# Patient Record
Sex: Female | Born: 1980 | Race: White | Hispanic: No | Marital: Married | Smoking: Never smoker
Health system: Southern US, Community
[De-identification: ages and names within clinical notes are randomized; demographics above are authoritative.]

## PROBLEM LIST (undated history)

## (undated) DIAGNOSIS — Z8489 Family history of other specified conditions: Secondary | ICD-10-CM

## (undated) DIAGNOSIS — Z9889 Other specified postprocedural states: Secondary | ICD-10-CM

## (undated) DIAGNOSIS — R112 Nausea with vomiting, unspecified: Secondary | ICD-10-CM

## (undated) DIAGNOSIS — T753XXA Motion sickness, initial encounter: Secondary | ICD-10-CM

## (undated) DIAGNOSIS — J45909 Unspecified asthma, uncomplicated: Secondary | ICD-10-CM

## (undated) HISTORY — PX: OVARIAN CYST REMOVAL: SHX89

## (undated) HISTORY — PX: CHOLECYSTECTOMY: SHX55

## (undated) HISTORY — PX: TONSILLECTOMY: SUR1361

## (undated) HISTORY — PX: APPENDECTOMY: SHX54

## (undated) HISTORY — PX: ABDOMINAL HYSTERECTOMY: SHX81

---

## 2014-01-25 ENCOUNTER — Other Ambulatory Visit: Payer: Self-pay | Admitting: Nurse Practitioner

## 2014-01-25 DIAGNOSIS — M25562 Pain in left knee: Secondary | ICD-10-CM

## 2014-01-26 ENCOUNTER — Ambulatory Visit
Admission: RE | Admit: 2014-01-26 | Discharge: 2014-01-26 | Disposition: A | Discharge status: Home / Self Care | Payer: PRIVATE HEALTH INSURANCE | Source: Ambulatory Visit | Attending: Nurse Practitioner | Admitting: Nurse Practitioner

## 2014-01-26 DIAGNOSIS — M25562 Pain in left knee: Secondary | ICD-10-CM

## 2014-01-29 ENCOUNTER — Other Ambulatory Visit: Payer: Self-pay

## 2015-08-12 ENCOUNTER — Encounter (HOSPITAL_COMMUNITY): Payer: Self-pay

## 2015-08-12 ENCOUNTER — Emergency Department (HOSPITAL_COMMUNITY): Payer: No Typology Code available for payment source

## 2015-08-12 ENCOUNTER — Emergency Department (HOSPITAL_COMMUNITY)
Admission: EM | Admit: 2015-08-12 | Discharge: 2015-08-12 | Disposition: A | Discharge status: Home / Self Care | Payer: No Typology Code available for payment source | Attending: Emergency Medicine | Admitting: Emergency Medicine

## 2015-08-12 DIAGNOSIS — Z880 Allergy status to penicillin: Secondary | ICD-10-CM | POA: Diagnosis not present

## 2015-08-12 DIAGNOSIS — S99912A Unspecified injury of left ankle, initial encounter: Secondary | ICD-10-CM | POA: Diagnosis present

## 2015-08-12 DIAGNOSIS — J329 Chronic sinusitis, unspecified: Secondary | ICD-10-CM

## 2015-08-12 DIAGNOSIS — X501XXA Overexertion from prolonged static or awkward postures, initial encounter: Secondary | ICD-10-CM | POA: Insufficient documentation

## 2015-08-12 DIAGNOSIS — S93402A Sprain of unspecified ligament of left ankle, initial encounter: Secondary | ICD-10-CM | POA: Diagnosis not present

## 2015-08-12 DIAGNOSIS — Y9289 Other specified places as the place of occurrence of the external cause: Secondary | ICD-10-CM | POA: Diagnosis not present

## 2015-08-12 DIAGNOSIS — Y998 Other external cause status: Secondary | ICD-10-CM | POA: Insufficient documentation

## 2015-08-12 DIAGNOSIS — Y9389 Activity, other specified: Secondary | ICD-10-CM | POA: Insufficient documentation

## 2015-08-12 MED ORDER — IBUPROFEN 800 MG PO TABS
800.0000 mg | ORAL_TABLET | Freq: Three times a day (TID) | ORAL | Status: DC
Start: 2015-08-12 — End: 2015-10-27

## 2015-08-12 MED ORDER — AZITHROMYCIN 250 MG PO TABS: 250.0000 mg | ORAL_TABLET | Freq: Every day | ORAL | Status: DC

## 2015-08-12 MED ORDER — HYDROCODONE-ACETAMINOPHEN 5-325 MG PO TABS: 1.0000 | ORAL_TABLET | Freq: Four times a day (QID) | ORAL | Status: DC | PRN

## 2015-08-12 NOTE — ED Provider Notes (Signed)
History  By signing my name below, I, Karle PlumberJennifer Tensley, attest that this documentation has been prepared under the direction and in the presence of Danelle BerryLeisa Rossetta Kama, PA-C. Electronically Signed: Karle PlumberJennifer Tensley, ED Scribe. 08/12/2015. 7:30 PM.  Chief Complaint  Patient presents with  . Ankle Injury   The history is provided by the patient and medical records. No language interpreter was used.    HPI Comments:  Beth Sheppard is a 34 y.o. obese female who presents to the Emergency Department complaining of a left ankle injury that occurred approximately 1.5 hours ago. Pt states she stepped off a curb and rolled the ankle. Pt reports feeling two pops at the time of the incident. She reports associated moderate pain of the left ankle, numbness to the plantar surface of the left foot and moderate swelling of the left ankle. Trying to move the left foot increases the pain. She denies alleviating factors. She has not taken anything for pain. She denies nausea or vomiting.  Pt also reports sinus pressure that started about two weeks ago. She has not taken anything for the symptoms today. She denies modifying factors. She denies fever or chills.   History reviewed. No pertinent past medical history. Past Surgical History  Procedure Laterality Date  . Tonsillectomy    . Cholecystectomy    . Appendectomy    . Ovarian cyst removal    . Abdominal hysterectomy     No family history on file. Social History  Substance Use Topics  . Smoking status: Never Smoker   . Smokeless tobacco: None  . Alcohol Use: No   OB History    No data available     Review of Systems  Constitutional: Negative for fever and chills.  HENT: Positive for sinus pressure.   Gastrointestinal: Negative for nausea and vomiting.  Musculoskeletal: Positive for joint swelling and arthralgias.  Skin: Negative for color change and wound.  Neurological: Positive for numbness.    Allergies  Amoxicillin and Serotonin reuptake  inhibitors (ssris)  Home Medications   Prior to Admission medications   Medication Sig Start Date End Date Taking? Authorizing Provider  azithromycin (ZITHROMAX) 250 MG tablet Take 1 tablet (250 mg total) by mouth daily. Take first 2 tablets together, then 1 every day until finished. 08/12/15   Danelle BerryLeisa Christpher Stogsdill, PA-C  HYDROcodone-acetaminophen (NORCO/VICODIN) 5-325 MG tablet Take 1-2 tablets by mouth every 6 (six) hours as needed for severe pain. 08/12/15   Danelle BerryLeisa Sharaine Delange, PA-C  ibuprofen (ADVIL,MOTRIN) 800 MG tablet Take 1 tablet (800 mg total) by mouth 3 (three) times daily. 08/12/15   Danelle BerryLeisa Schae Cando, PA-C   Triage Vitals: BP 130/95 mmHg  Pulse 79  Temp(Src) 97.3 F (36.3 C) (Oral)  Resp 18  SpO2 100% Physical Exam  Constitutional: She is oriented to person, place, and time. She appears well-developed and well-nourished. No distress.  HENT:  Head: Normocephalic and atraumatic.  Right Ear: External ear normal.  Left Ear: External ear normal.  Nose: Nose normal.  Mouth/Throat: Oropharynx is clear and moist. No oropharyngeal exudate.  Eyes: Conjunctivae and EOM are normal. Pupils are equal, round, and reactive to light. Right eye exhibits no discharge. Left eye exhibits no discharge. No scleral icterus.  Neck: Normal range of motion. Neck supple. No JVD present. No tracheal deviation present.  Cardiovascular: Normal rate and regular rhythm.   Pulses:      Dorsalis pedis pulses are 2+ on the right side, and 2+ on the left side.  Posterior tibial pulses are 2+ on the right side, and 2+ on the left side.  Pulmonary/Chest: Effort normal and breath sounds normal. No stridor. No respiratory distress.  Musculoskeletal: She exhibits edema and tenderness.       Left ankle: She exhibits decreased range of motion and swelling. She exhibits no ecchymosis, no deformity and no laceration. Tenderness. Lateral malleolus, AITFL and CF ligament tenderness found. No medial malleolus, no posterior TFL, no head  of 5th metatarsal and no proximal fibula tenderness found. Achilles tendon normal. Achilles tendon exhibits no defect and normal Thompson's test results.  Foot held in plantarflexion, with limited ROM, strength 2/5, when attempting to flex, great toe flexes minimally, otherwise toes and foot flexion limited.   Lymphadenopathy:    She has no cervical adenopathy.  Neurological: She is alert and oriented to person, place, and time. She exhibits normal muscle tone. Coordination normal.  Skin: Skin is warm and dry. No rash noted. She is not diaphoretic. No erythema. No pallor.  Psychiatric: She has a normal mood and affect. Her behavior is normal. Judgment and thought content normal.  Nursing note and vitals reviewed.   ED Course  Procedures (including critical care time) DIAGNOSTIC STUDIES: Oxygen Saturation is 100% on RA, normal by my interpretation.   COORDINATION OF CARE: 7:20 PM- Informed pt of negative X-Ray. Will order tib/fib X-ray. Will give referral to orthopedics. Pt verbalizes understanding and agrees to plan.  Medications - No data to display  Labs Review Labs Reviewed - No data to display  Imaging Review No results found. I have personally reviewed and evaluated these images and lab results as part of my medical decision-making.   EKG Interpretation None      MDM   Final diagnoses:  Severe ankle sprain, left, initial encounter  Sinusitis, unspecified chronicity, unspecified location    Patient with rolled ankle, pain and edema of left lateral malleolus.  X-Ray negative for obvious fracture or dislocation.  Pt's left ankle normal pulses, normal color, edema of lateral malleolus Pt complained of decreased sensation, pt can minimally move great toe or, minimal plantar flexion, decreased strength, normal passive ROM. Tib/fib added with concerns for peroneal nerve injury with decreased ROM, strength and sensation.  L tib/fib was also negative. The case was discussed  with Dr. Dalene Seltzer. Pt advised to follow up with orthopedics (established with ortho in [place]). Patient given camwalker and crutches while in ED, conservative therapy recommended and discussed, pain meds, RICE tx, and NSAIDS.   Patient will be discharged home & is agreeable with above plan. Returns precautions discussed.   Pt discharged home in stable condition.    I personally performed the services described in this documentation, which was scribed in my presence. The recorded information has been reviewed and is accurate.      Danelle Berry, PA-C 08/29/15 0981  Alvira Monday, MD 08/29/15 2053

## 2015-08-12 NOTE — Discharge Instructions (Signed)
Ankle Sprain °An ankle sprain is an injury to the strong, fibrous tissues (ligaments) that hold the bones of your ankle joint together.  °CAUSES °An ankle sprain is usually caused by a fall or by twisting your ankle. Ankle sprains most commonly occur when you step on the outer edge of your foot, and your ankle turns inward. People who participate in sports are more prone to these types of injuries.  °SYMPTOMS  °· Pain in your ankle. The pain may be present at rest or only when you are trying to stand or walk. °· Swelling. °· Bruising. Bruising may develop immediately or within 1 to 2 days after your injury. °· Difficulty standing or walking, particularly when turning corners or changing directions. °DIAGNOSIS  °Your caregiver will ask you details about your injury and perform a physical exam of your ankle to determine if you have an ankle sprain. During the physical exam, your caregiver will press on and apply pressure to specific areas of your foot and ankle. Your caregiver will try to move your ankle in certain ways. An X-ray exam may be done to be sure a bone was not broken or a ligament did not separate from one of the bones in your ankle (avulsion fracture).  °TREATMENT  °Certain types of braces can help stabilize your ankle. Your caregiver can make a recommendation for this. Your caregiver may recommend the use of medicine for pain. If your sprain is severe, your caregiver may refer you to a surgeon who helps to restore function to parts of your skeletal system (orthopedist) or a physical therapist. °HOME CARE INSTRUCTIONS  °· Apply ice to your injury for 1-2 days or as directed by your caregiver. Applying ice helps to reduce inflammation and pain. °· Put ice in a plastic bag. °· Place a towel between your skin and the bag. °· Leave the ice on for 15-20 minutes at a time, every 2 hours while you are awake. °· Only take over-the-counter or prescription medicines for pain, discomfort, or fever as directed by  your caregiver. °· Elevate your injured ankle above the level of your heart as much as possible for 2-3 days. °· If your caregiver recommends crutches, use them as instructed. Gradually put weight on the affected ankle. Continue to use crutches or a cane until you can walk without feeling pain in your ankle. °· If you have a plaster splint, wear the splint as directed by your caregiver. Do not rest it on anything harder than a pillow for the first 24 hours. Do not put weight on it. Do not get it wet. You may take it off to take a shower or bath. °· You may have been given an elastic bandage to wear around your ankle to provide support. If the elastic bandage is too tight (you have numbness or tingling in your foot or your foot becomes cold and blue), adjust the bandage to make it comfortable. °· If you have an air splint, you may blow more air into it or let air out to make it more comfortable. You may take your splint off at night and before taking a shower or bath. Wiggle your toes in the splint several times per day to decrease swelling. °SEEK MEDICAL CARE IF:  °· You have rapidly increasing bruising or swelling. °· Your toes feel extremely cold or you lose feeling in your foot. °· Your pain is not relieved with medicine. °SEEK IMMEDIATE MEDICAL CARE IF: °· Your toes are numb or blue. °·   You have severe pain that is increasing. MAKE SURE YOU:   Understand these instructions.  Will watch your condition.  Will get help right away if you are not doing well or get worse.   This information is not intended to replace advice given to you by your health care provider. Make sure you discuss any questions you have with your health care provider.   Document Released: 08/19/2005 Document Revised: 09/09/2014 Document Reviewed: 08/31/2011 Elsevier Interactive Patient Education 2016 Else[employer] Inc[employer]  Sinusitis, Adult Sinusitis is redness, soreness, and inflammation of the paranasal sinuses. Paranasal sinuses are  air pockets within the bones of your face. They are located beneath your eyes, in the middle of your forehead, and above your eyes. In healthy paranasal sinuses, mucus is able to drain out, and air is able to circulate through them by way of your nose. However, when your paranasal sinuses are inflamed, mucus and air can become trapped. This can allow bacteria and other germs to grow and cause infection. Sinusitis can develop quickly and last only a short time (acute) or continue over a long period (chronic). Sinusitis that lasts for more than 12 weeks is considered chronic. CAUSES Causes of sinusitis include:  Allergies.  Structural abnormalities, such as displacement of the cartilage that separates your nostrils (deviated septum), which can decrease the air flow through your nose and sinuses and affect sinus drainage.  Functional abnormalities, such as when the small hairs (cilia) that line your sinuses and help remove mucus do not work properly or are not present. SIGNS AND SYMPTOMS Symptoms of acute and chronic sinusitis are the same. The primary symptoms are pain and pressure around the affected sinuses. Other symptoms include:  Upper toothache.  Earache.  Headache.  Bad breath.  Decreased sense of smell and taste.  A cough, which worsens when you are lying flat.  Fatigue.  Fever.  Thick drainage from your nose, which often is green and may contain pus (purulent).  Swelling and warmth over the affected sinuses. DIAGNOSIS Your health care provider will perform a physical exam. During your exam, your health care provider may perform any of the following to help determine if you have acute sinusitis or chronic sinusitis:  Look in your nose for signs of abnormal growths in your nostrils (nasal polyps).  Tap over the affected sinus to check for signs of infection.  View the inside of your sinuses using an imaging device that has a light attached (endoscope). If your health care  provider suspects that you have chronic sinusitis, one or more of the following tests may be recommended:  Allergy tests.  Nasal culture. A sample of mucus is taken from your nose, sent to a lab, and screened for bacteria.  Nasal cytology. A sample of mucus is taken from your nose and examined by your health care provider to determine if your sinusitis is related to an allergy. TREATMENT Most cases of acute sinusitis are related to a viral infection and will resolve on their own within 10 days. Sometimes, medicines are prescribed to help relieve symptoms of both acute and chronic sinusitis. These may include pain medicines, decongestants, nasal steroid sprays, or saline sprays. However, for sinusitis related to a bacterial infection, your health care provider will prescribe antibiotic medicines. These are medicines that will help kill the bacteria causing the infection. Rarely, sinusitis is caused by a fungal infection. In these cases, your health care provider will prescribe antifungal medicine. For some cases of chronic sinusitis, surgery is needed.  Generally, these are cases in which sinusitis recurs more than 3 times per year, despite other treatments. HOME CARE INSTRUCTIONS  Drink plenty of water. Water helps thin the mucus so your sinuses can drain more easily.  Use a humidifier.  Inhale steam 3-4 times a day (for example, sit in the bathroom with the shower running).  Apply a warm, moist washcloth to your face 3-4 times a day, or as directed by your health care provider.  Use saline nasal sprays to help moisten and clean your sinuses.  Take medicines only as directed by your health care provider.  If you were prescribed either an antibiotic or antifungal medicine, finish it all even if you start to feel better. SEEK IMMEDIATE MEDICAL CARE IF:  You have increasing pain or severe headaches.  You have nausea, vomiting, or drowsiness.  You have swelling around your face.  You  have vision problems.  You have a stiff neck.  You have difficulty breathing.   This information is not intended to replace advice given to you by your health care provider. Make sure you discuss any questions you have with your health care provider.   Document Released: 08/19/2005 Document Revised: 09/09/2014 Document Reviewed: 09/03/2011 Elsevier Interactive Patient Education [employer] Else[employer] Inc.

## 2015-08-12 NOTE — ED Notes (Signed)
Pt presents with c/o left ankle injury. Pt reports that she rolled her ankle off of the curb, reports that her foot is numb at this time.

## 2015-09-20 ENCOUNTER — Other Ambulatory Visit: Payer: Self-pay | Admitting: Podiatry

## 2015-09-20 DIAGNOSIS — S93402D Sprain of unspecified ligament of left ankle, subsequent encounter: Secondary | ICD-10-CM

## 2015-10-10 ENCOUNTER — Ambulatory Visit
Admission: RE | Admit: 2015-10-10 | Discharge: 2015-10-10 | Disposition: A | Discharge status: Home / Self Care | Payer: No Typology Code available for payment source | Source: Ambulatory Visit | Attending: Podiatry | Admitting: Podiatry

## 2015-10-10 DIAGNOSIS — S93402D Sprain of unspecified ligament of left ankle, subsequent encounter: Secondary | ICD-10-CM

## 2015-10-10 DIAGNOSIS — S96812A Strain of other specified muscles and tendons at ankle and foot level, left foot, initial encounter: Secondary | ICD-10-CM | POA: Diagnosis not present

## 2015-10-10 DIAGNOSIS — S93402A Sprain of unspecified ligament of left ankle, initial encounter: Secondary | ICD-10-CM | POA: Diagnosis present

## 2015-10-10 DIAGNOSIS — M25472 Effusion, left ankle: Secondary | ICD-10-CM | POA: Insufficient documentation

## 2015-10-10 DIAGNOSIS — M25572 Pain in left ankle and joints of left foot: Secondary | ICD-10-CM | POA: Insufficient documentation

## 2015-10-27 ENCOUNTER — Encounter: Payer: Self-pay | Admitting: *Deleted

## 2015-11-01 ENCOUNTER — Ambulatory Visit
Admission: RE | Admit: 2015-11-01 | Discharge: 2015-11-01 | Disposition: A | Discharge status: Home / Self Care | Payer: No Typology Code available for payment source | Source: Ambulatory Visit | Attending: Podiatry | Admitting: Podiatry

## 2015-11-01 ENCOUNTER — Ambulatory Visit: Payer: No Typology Code available for payment source | Admitting: Anesthesiology

## 2015-11-01 ENCOUNTER — Encounter
Admission: RE | Disposition: A | Discharge status: Home / Self Care | Payer: Self-pay | Source: Ambulatory Visit | Attending: Podiatry

## 2015-11-01 DIAGNOSIS — M65872 Other synovitis and tenosynovitis, left ankle and foot: Secondary | ICD-10-CM | POA: Insufficient documentation

## 2015-11-01 DIAGNOSIS — Y939 Activity, unspecified: Secondary | ICD-10-CM | POA: Diagnosis not present

## 2015-11-01 DIAGNOSIS — S96812A Strain of other specified muscles and tendons at ankle and foot level, left foot, initial encounter: Secondary | ICD-10-CM | POA: Insufficient documentation

## 2015-11-01 DIAGNOSIS — X58XXXA Exposure to other specified factors, initial encounter: Secondary | ICD-10-CM | POA: Insufficient documentation

## 2015-11-01 DIAGNOSIS — M76829 Posterior tibial tendinitis, unspecified leg: Secondary | ICD-10-CM | POA: Diagnosis present

## 2015-11-01 DIAGNOSIS — J45909 Unspecified asthma, uncomplicated: Secondary | ICD-10-CM | POA: Insufficient documentation

## 2015-11-01 HISTORY — PX: ANKLE ARTHROSCOPY: SHX545

## 2015-11-01 HISTORY — DX: Nausea with vomiting, unspecified: R11.2

## 2015-11-01 HISTORY — DX: Motion sickness, initial encounter: T75.3XXA

## 2015-11-01 HISTORY — DX: Family history of other specified conditions: Z84.89

## 2015-11-01 HISTORY — DX: Other specified postprocedural states: Z98.890

## 2015-11-01 HISTORY — DX: Unspecified asthma, uncomplicated: J45.909

## 2015-11-01 SURGERY — ARTHROSCOPY, ANKLE
Anesthesia: Regional | Site: Foot | Laterality: Left | Wound class: Clean

## 2015-11-01 MED ORDER — OXYCODONE HCL 5 MG/5ML PO SOLN: 5.0000 mg | Freq: Once | ORAL | Status: AC | PRN

## 2015-11-01 MED ORDER — FENTANYL CITRATE (PF) 100 MCG/2ML IJ SOLN: 25.0000 ug | INTRAMUSCULAR | Status: DC | PRN

## 2015-11-01 MED ORDER — ROPIVACAINE HCL 5 MG/ML IJ SOLN
INTRAMUSCULAR | Status: DC | PRN
  Administered 2015-11-01: 50 mg via EPIDURAL
  Administered 2015-11-01: 150 mg via EPIDURAL

## 2015-11-01 MED ORDER — LACTATED RINGERS IV SOLN
INTRAVENOUS | Status: DC
  Administered 2015-11-01 (×2): via INTRAVENOUS

## 2015-11-01 MED ORDER — FENTANYL CITRATE (PF) 100 MCG/2ML IJ SOLN
INTRAMUSCULAR | Status: DC | PRN
  Administered 2015-11-01: 100 ug via INTRAVENOUS
  Administered 2015-11-01 (×2): 25 ug via INTRAVENOUS

## 2015-11-01 MED ORDER — OXYCODONE-ACETAMINOPHEN 5-325 MG PO TABS: 1.0000 | ORAL_TABLET | ORAL | Status: AC | PRN

## 2015-11-01 MED ORDER — BUPIVACAINE HCL (PF) 0.25 % IJ SOLN
INTRAMUSCULAR | Status: DC | PRN
  Administered 2015-11-01: 20 mL

## 2015-11-01 MED ORDER — ONDANSETRON HCL 4 MG/2ML IJ SOLN: 4.0000 mg | Freq: Four times a day (QID) | INTRAMUSCULAR | Status: DC | PRN

## 2015-11-01 MED ORDER — PROMETHAZINE HCL 25 MG/ML IJ SOLN
6.2500 mg | INTRAMUSCULAR | Status: DC | PRN
  Administered 2015-11-01: 6.25 mg via INTRAVENOUS

## 2015-11-01 MED ORDER — OXYCODONE HCL 5 MG PO TABS
5.0000 mg | ORAL_TABLET | Freq: Once | ORAL | Status: AC | PRN
  Administered 2015-11-01: 5 mg via ORAL

## 2015-11-01 MED ORDER — ONDANSETRON HCL 4 MG/2ML IJ SOLN
INTRAMUSCULAR | Status: DC | PRN
  Administered 2015-11-01: 4 mg via INTRAVENOUS

## 2015-11-01 MED ORDER — ONDANSETRON HCL 4 MG PO TABS: 4.0000 mg | ORAL_TABLET | Freq: Four times a day (QID) | ORAL | Status: DC | PRN

## 2015-11-01 MED ORDER — GLYCOPYRROLATE 0.2 MG/ML IJ SOLN
INTRAMUSCULAR | Status: DC | PRN
  Administered 2015-11-01: 0.1 mg via INTRAVENOUS

## 2015-11-01 MED ORDER — SCOPOLAMINE 1 MG/3DAYS TD PT72
1.0000 | MEDICATED_PATCH | Freq: Once | TRANSDERMAL | Status: DC
  Administered 2015-11-01: 1.5 mg via TRANSDERMAL

## 2015-11-01 MED ORDER — PROPOFOL 10 MG/ML IV BOLUS
INTRAVENOUS | Status: DC | PRN
  Administered 2015-11-01: 140 mg via INTRAVENOUS

## 2015-11-01 MED ORDER — OXYCODONE-ACETAMINOPHEN 5-325 MG PO TABS: 1.0000 | ORAL_TABLET | ORAL | Status: DC | PRN

## 2015-11-01 MED ORDER — DEXAMETHASONE SODIUM PHOSPHATE 4 MG/ML IJ SOLN
INTRAMUSCULAR | Status: DC | PRN
  Administered 2015-11-01: 4 mg via INTRAVENOUS

## 2015-11-01 MED ORDER — MIDAZOLAM HCL 2 MG/2ML IJ SOLN
INTRAMUSCULAR | Status: DC | PRN
  Administered 2015-11-01 (×2): 2 mg via INTRAVENOUS

## 2015-11-01 MED ORDER — SODIUM CHLORIDE 0.9 % IV SOLN
600.0000 mg | Freq: Once | INTRAVENOUS | Status: AC
  Administered 2015-11-01: 600 mg via INTRAVENOUS

## 2015-11-01 MED ORDER — LIDOCAINE HCL (CARDIAC) 20 MG/ML IV SOLN
INTRAVENOUS | Status: DC | PRN
  Administered 2015-11-01: 50 mg via INTRATRACHEAL

## 2015-11-01 SURGICAL SUPPLY — 45 items
BANDAGE ELASTIC 4 CLIP NS LF (GAUZE/BANDAGES/DRESSINGS) ×6 IMPLANT
BENZOIN TINCTURE PRP APPL 2/3 (GAUZE/BANDAGES/DRESSINGS) ×3 IMPLANT
BLADE AGGRESSIVE PLUS 4.0 (BLADE) ×3 IMPLANT
BNDG COHESIVE 4X5 TAN STRL (GAUZE/BANDAGES/DRESSINGS) ×3 IMPLANT
BNDG ESMARK 4X12 TAN STRL LF (GAUZE/BANDAGES/DRESSINGS) ×3 IMPLANT
BNDG GAUZE 4.5X4.1 6PLY STRL (MISCELLANEOUS) IMPLANT
BNDG STRETCH 4X75 STRL LF (GAUZE/BANDAGES/DRESSINGS) ×3 IMPLANT
BUR AGGRESSIVE+ 2.5 (BURR) ×3 IMPLANT
CANISTER SUCT 1200ML W/VALVE (MISCELLANEOUS) ×3 IMPLANT
COVER LIGHT HANDLE UNIVERSAL (MISCELLANEOUS) ×6 IMPLANT
CUFF TOURN SGL QUICK 34 (TOURNIQUET CUFF) ×1
CUFF TRNQT CYL 34X4X40X1 (TOURNIQUET CUFF) ×2 IMPLANT
DURAPREP 26ML APPLICATOR (WOUND CARE) ×3 IMPLANT
ETHIBOND 2 0 GREEN CT 2 30IN (SUTURE) ×3 IMPLANT
GAUZE PETRO XEROFOAM 1X8 (MISCELLANEOUS) ×3 IMPLANT
GAUZE SPONGE 4X4 12PLY STRL (GAUZE/BANDAGES/DRESSINGS) ×3 IMPLANT
GAUZE STRETCH 2X75IN STRL (MISCELLANEOUS) ×3 IMPLANT
GLOVE BIO SURGEON STRL SZ7.5 (GLOVE) ×6 IMPLANT
GLOVE INDICATOR 8.0 STRL GRN (GLOVE) ×6 IMPLANT
GOWN STRL REUS W/ TWL LRG LVL3 (GOWN DISPOSABLE) ×4 IMPLANT
GOWN STRL REUS W/TWL LRG LVL3 (GOWN DISPOSABLE) ×2
IV LACTATED RINGER IRRG 3000ML (IV SOLUTION) ×2
IV LR IRRIG 3000ML ARTHROMATIC (IV SOLUTION) ×4 IMPLANT
KIT ROOM TURNOVER OR (KITS) ×3 IMPLANT
MANIFOLD 4PT FOR NEPTUNE1 (MISCELLANEOUS) ×3 IMPLANT
NS IRRIG 500ML POUR BTL (IV SOLUTION) ×3 IMPLANT
PACK ARTHROSCOPY KNEE (MISCELLANEOUS) IMPLANT
PACK EXTREMITY ARMC (MISCELLANEOUS) ×3 IMPLANT
PAD GROUND ADULT SPLIT (MISCELLANEOUS) ×3 IMPLANT
SET TUBE SUCT SHAVER OUTFL 24K (TUBING) ×3 IMPLANT
SPLINT CAST 1 STEP 5X30 WHT (MISCELLANEOUS) ×3 IMPLANT
STOCKINETTE STRL 6IN 960660 (GAUZE/BANDAGES/DRESSINGS) ×3 IMPLANT
STRAP BODY AND KNEE 60X3 (MISCELLANEOUS) ×6 IMPLANT
STRIP CLOSURE SKIN 1/4X4 (GAUZE/BANDAGES/DRESSINGS) ×3 IMPLANT
SUT ETHILON 3-0 FS-10 30 BLK (SUTURE) ×6
SUT ETHILON 4-0 (SUTURE)
SUT ETHILON 4-0 FS2 18XMFL BLK (SUTURE)
SUT PDS 2-0 27IN (SUTURE) ×3 IMPLANT
SUT VIC AB 3-0 SH 27 (SUTURE) ×1
SUT VIC AB 3-0 SH 27X BRD (SUTURE) ×2 IMPLANT
SUT VIC AB 4-0 FS2 27 (SUTURE) IMPLANT
SUTURE EHLN 3-0 FS-10 30 BLK (SUTURE) ×4 IMPLANT
SUTURE ETHLN 4-0 FS2 18XMF BLK (SUTURE) IMPLANT
TUBING ARTHRO INFLOW-ONLY STRL (TUBING) ×3 IMPLANT
WAND TOPAZ MICRO DEBRIDER (MISCELLANEOUS) ×6 IMPLANT

## 2015-11-01 NOTE — Anesthesia Preprocedure Evaluation (Signed)
Anesthesia Evaluation  Patient identified by MRN, date of birth, ID band Patient awake    Reviewed: Allergy & Precautions, H&P , NPO status , Patient's Chart, lab work & pertinent test results, reviewed documented beta blocker date and time   History of Anesthesia Complications (+) PONV and history of anesthetic complications  Airway Mallampati: II  TM Distance: >3 FB Neck ROM: full    Dental no notable dental hx.    Pulmonary asthma (allergy-related) ,    Pulmonary exam normal breath sounds clear to auscultation       Cardiovascular Exercise Tolerance: Good negative cardio ROS   Rhythm:regular Rate:Normal     Neuro/Psych negative neurological ROS  negative psych ROS   GI/Hepatic negative GI ROS, Neg liver ROS,   Endo/Other  negative endocrine ROS  Renal/GU negative Renal ROS  negative genitourinary   Musculoskeletal   Abdominal   Peds  Hematology negative hematology ROS (+)   Anesthesia Other Findings   Reproductive/Obstetrics negative OB ROS                             Anesthesia Physical Anesthesia Plan  ASA: II  Anesthesia Plan: General and Regional   Post-op Pain Management: GA combined w/ Regional for post-op pain   Induction:   Airway Management Planned:   Additional Equipment:   Intra-op Plan:   Post-operative Plan:   Informed Consent: I have reviewed the patients History and Physical, chart, labs and discussed the procedure including the risks, benefits and alternatives for the proposed anesthesia with the patient or authorized representative who has indicated his/her understanding and acceptance.   Dental Advisory Given  Plan Discussed with: CRNA  Anesthesia Plan Comments:         Anesthesia Quick Evaluation

## 2015-11-01 NOTE — Transfer of Care (Signed)
Immediate Anesthesia Transfer of Care Note  Patient: Liliann File  Procedure(s) Performed: Procedure(s) with comments: ANKLE ARTHROSCOPY (Left) - WITH POPLITEAL PERONEAL TENDON REPAIR (Left) TIBIALIS TENDONITIS REPAIR TENOLYSIS (Left)  Patient Location: PACU  Anesthesia Type: General, Regional  Level of Consciousness: awake, alert  and patient cooperative  Airway and Oxygen Therapy: Patient Spontanous Breathing and Patient connected to supplemental oxygen  Post-op Assessment: Post-op Vital signs reviewed, Patient's Cardiovascular Status Stable, Respiratory Function Stable, Patent Airway and No signs of Nausea or vomiting  Post-op Vital Signs: Reviewed and stable  Complications: No apparent anesthesia complications

## 2015-11-01 NOTE — H&P (Signed)
  HISTORY AND PHYSICAL INTERVAL NOTE:  11/01/2015  12:44 PM  Beth Sheppard  has presented today for surgery, with the diagnosis of S86.312S PERONEALTENDON TEAR M76.822 TIBIAL TENDINITIS LEFT LOWER EXTREMITY M65.9 SYNOVITIS.  The various methods of treatment have been discussed with the patient.  No guarantees were given.  After consideration of risks, benefits and other options for treatment, the patient has consented to surgery.  I have reviewed the patients' chart and labs.    Patient Vitals for the past 24 hrs:  BP Temp Temp src Pulse Resp SpO2 Height Weight  11/01/15 1030 112/73 mmHg 97.5 F (36.4 C) Temporal 70 14 98 %  (1.702 m) 112.946 kg (249 lb)    A history and physical examination was performed in my office.  The patient was reexamined.  There have been no changes to this history and physical examination.  Gwyneth Revels A

## 2015-11-01 NOTE — Op Note (Signed)
Operative note   Surgeon:Stephano Arrants [employer] logistics/support/administrative officer: None    Preop diagnosis: 1. Peroneal brevis tendon tear  2. Peroneal longus tenosynovitis 3. Posterior tibial tendinitis and tenosynovitis  4. Ankle joint synovitis all left lower extremity    Postop diagnosis: Same    Procedure:1. Peroneal brevis tendon repair  2. tenolysis peroneal longus tendon  3. Tenolysis a posterior tibial tendon  4. Capsulitis and synovitis all left lower extremity    EBL: Minimal    Anesthesia:regional and general    Hemostasis: Thigh tourniquet inflated to 325 mmHg for 70 minutes    Specimen: Torn peroneal brevis tendon and tenosynovitis    Complications: None    Operative indications:Beth Sheppard is an 35 y.o. that presents today for surgical intervention.  The risks/benefits/alternatives/complications have been discussed and consent has been given.    Procedure:  Patient was brought into the OR and placed on the operating table in thesupine position. After anesthesia was obtained theleft lower extremity was prepped and draped in usual sterile fashion.  Attention was directed to the anterior aspect of the ankle joint where the anteromedial and lateral portals were made with a superficial skin incision and hemostat down to the capsule. At this time the anteromedial central and lateral aspect of the ankle joint was evaluated. The cartilage was noted be intact. There was a fair amount of capsulitis noted. There was a small accessory anterior inferior tibiotalar ligament. This was causing and anterior impingement syndrome. Debridement extensively was performed throughout the ankle joint. The accessory ligament was noted and debrided and removed. At this time no further impingement syndrome was noted. The ankle arthroscopy was completed. Ankle at tendon. Sharp and blunt dissection carried down to the tendon sheath. This was then opened. The posterior tibial tendon was noted with minimal tenosynovitis. This was  removed. The Topaz wand was then used along the posterior tibial tendon for decreasing inflammation and increasing circulation. Closure was performed with a 3-0 Vicryl for the deeper and subcutaneous tissue and a 3-0 nylon for the skin.  Attention was then directed laterally overlying the peroneal tendons where approximately 5 cm proximal to the ankle joint extending distally to just proximal to the fifth metatarsal base a curved incision was performed. Sharp and blunt dissection carried down to the tendon sheath. The tendon sheath was then opened along the incision site. There was noted be a fairly extensive split peroneal brevis tendon tear. This was evaluated. I was able to remove the small peroneal brevis tendon tear. The tendon itself that was remaining was in good condition. I was able to apply the Topaz wand along the length of the tendon and tubularize this with a 2-0 Ethibond suture. The peroneal longus tendon was noted to have a small amount of tenosynovitis and the Topaz wand was used along this area. There was also noted to be a low-lying muscle belly of the peroneal brevis tendon and this was resected to proximal to the ankle joint. All areas were flushed with irrigation. Closure was performed to the peroneal retinaculum with a 2-0 Ethibond. The peroneal tendon sheath was closed with a 2-0 PDS. Subcutaneous tissue was closed with a 3-0 Vicryl and the skin closed with 3-0 nylon. The anterior medial and lateral portals were closed with 3-0 nylon. All areas were then infiltrated with 0.25% Marcaine. A well compressive bulky sterile dressing was applied. A posterior splint was then applied.      Patient tolerated the procedure and anesthesia well.  Was  transported from the OR to the PACU with all vital signs stable and vascular status intact. To be discharged per routine protocol.  Will follow up in approximately 1 week in the outpatient clinic.

## 2015-11-01 NOTE — Anesthesia Postprocedure Evaluation (Signed)
Anesthesia Post Note  Patient: Beth Sheppard  Procedure(s) Performed: Procedure(s) (LRB): Peroneal brevis tendon repair 2. tenolysis peroneal longus tendon 3. Tenolysis a posterior tibial tendon 4. Capsulitis and synovitis all left lower extremity   (Left)  Patient location during evaluation: PACU Anesthesia Type: General Level of consciousness: awake and alert Pain management: pain level controlled Vital Signs Assessment: post-procedure vital signs reviewed and stable Respiratory status: spontaneous breathing, nonlabored ventilation, respiratory function stable and patient connected to nasal cannula oxygen Cardiovascular status: blood pressure returned to baseline and stable Postop Assessment: no signs of nausea or vomiting Anesthetic complications: no    [name]

## 2015-11-01 NOTE — Anesthesia Procedure Notes (Addendum)
Anesthesia Regional Block:  Popliteal block  Pre-Anesthetic Checklist: ,, timeout performed, Correct Patient, Correct Site, Correct Laterality, Correct Procedure, Correct Position, site marked, Risks and benefits discussed,  Surgical consent,  Pre-op evaluation,  At surgeon's request and post-op pain management   Prep: chloraprep       Needles:  Injection technique: Single-shot  Needle Type: Echogenic Needle     Needle Length: 9cm 9 cm Needle Gauge: 21 and 21 G    Additional Needles:  Procedures: ultrasound guided (picture in chart) and nerve stimulator  Motor weakness within 10 minutes. Popliteal block Narrative:  Start time: 11/01/2015 11:03 AM End time: 11/01/2015 11:12 AM Injection made incrementally with aspirations every 5 mL.  Performed by: Personally  Anesthesiologist: BEACH, RACHEL B  Additional Notes: Functioning IV was confirmed and monitors applied. Ultrasound guidance: relevant anatomy identified, needle position confirmed, local anesthetic spread visualized around nerve(s)., vascular puncture avoided.  Image printed for medical record.  Negative aspiration and no paresthesias; incremental administration of local anesthetic. The patient tolerated the procedure well. Vitals signes recorded in RN notes.   Procedure Name: LMA Insertion Date/Time: 11/01/2015 1:10 PM Performed by: Jimmy Picket Pre-anesthesia Checklist: Patient identified, Emergency Drugs available, Suction available, Timeout performed and Patient being monitored Patient Re-evaluated:Patient Re-evaluated prior to inductionOxygen Delivery Method: Circle system utilized Preoxygenation: Pre-oxygenation with 100% oxygen Intubation Type: IV induction LMA: LMA inserted LMA Size: 4.0 Number of attempts: 1 Placement Confirmation: positive ETCO2 and breath sounds checked- equal and bilateral Tube secured with: Tape

## 2015-11-01 NOTE — Discharge Instructions (Signed)
[facility]  POST OPERATIVE INSTRUCTIONS FOR DR. TROXLER AND DR[facility]   1. Take your medication as prescribed.  Pain medication should be taken only as needed.  2. Keep the dressing clean, dry and intact.  3. Keep your foot elevated above the heart level for the first 48 hours.  4. Walking to the bathroom and brief periods of walking are acceptable, unless we have instructed you to be non-weight bearing.  5. Always wear your post-op shoe when walking.  Always use your crutches if you are to be non-weight bearing.  6. Do not take a shower.  Keep dressing and splint clean and dry.     7. Call [facility] [phone]) if any of the following problems occur: - You develop a temperature or fever. - The bandage becomes saturated with blood. - Medication does not stop your pain. - Injury of the foot occurs. - Any symptoms of infection including redness, odor, or red streaks running from wound.  General Anesthesia, Adult, Care After Refer to this sheet in the next few weeks. These instructions provide you with information on caring for yourself after your procedure. Your health care provider may also give you more specific instructions. Your treatment has been planned according to current medical practices, but problems sometimes occur. Call your health care provider if you have any problems or questions after your procedure. WHAT TO EXPECT AFTER THE PROCEDURE After the procedure, it is typical to experience:  Sleepiness.  Nausea and vomiting. HOME CARE INSTRUCTIONS  For the first 24 hours after general anesthesia:  Have a responsible person with you.  Do not drive a car. If you are alone, do not take public transportation.  Do not drink alcohol.  Do not take medicine that has not been prescribed by your health care provider.  Do not sign important papers or make important decisions.  You  may resume a normal diet and activities as directed by your health care provider.  Change bandages (dressings) as directed.  If you have questions or problems that seem related to general anesthesia, call the hospital and ask for the anesthetist or anesthesiologist on call. SEEK MEDICAL CARE IF:  You have nausea and vomiting that continue the day after anesthesia.  You develop a rash. SEEK IMMEDIATE MEDICAL CARE IF:   You have difficulty breathing.  You have chest pain.  You have any allergic problems.   This information is not intended to replace advice given to you by your health care provider. Make sure you discuss any questions you have with your health care provider.   Document Released: 11/25/2000 Document Revised: 09/09/2014 Document Reviewed: 12/18/2011 Elsevier Interactive Patient Education [employer]

## 2015-11-01 NOTE — Progress Notes (Signed)
Assisted [employer] ANMD with left, popliteal/saphenous block. Side rails up, monitors on throughout procedure. See vital signs in flow sheet. Tolerated Procedure well.

## 2015-11-02 ENCOUNTER — Encounter: Payer: Self-pay | Admitting: Podiatry

## 2015-11-03 LAB — SURGICAL PATHOLOGY

## 2015-11-22 ENCOUNTER — Ambulatory Visit
Admission: RE | Admit: 2015-11-22 | Discharge: 2015-11-22 | Disposition: A | Discharge status: Home / Self Care | Payer: No Typology Code available for payment source | Source: Ambulatory Visit | Attending: Podiatry | Admitting: Podiatry

## 2015-11-22 ENCOUNTER — Other Ambulatory Visit: Payer: Self-pay | Admitting: Podiatry

## 2015-11-22 DIAGNOSIS — M79662 Pain in left lower leg: Secondary | ICD-10-CM

## 2015-11-22 DIAGNOSIS — R609 Edema, unspecified: Secondary | ICD-10-CM | POA: Insufficient documentation

## 2016-02-06 ENCOUNTER — Other Ambulatory Visit: Payer: Self-pay | Admitting: Podiatry

## 2016-02-06 ENCOUNTER — Ambulatory Visit
Admission: RE | Admit: 2016-02-06 | Discharge: 2016-02-06 | Disposition: A | Discharge status: Home / Self Care | Payer: No Typology Code available for payment source | Source: Ambulatory Visit | Attending: Podiatry | Admitting: Podiatry

## 2016-02-06 DIAGNOSIS — M79605 Pain in left leg: Secondary | ICD-10-CM | POA: Insufficient documentation

## 2016-02-06 DIAGNOSIS — M25562 Pain in left knee: Secondary | ICD-10-CM

## 2016-02-07 ENCOUNTER — Other Ambulatory Visit: Payer: Self-pay | Admitting: Podiatry

## 2016-02-07 DIAGNOSIS — S93402D Sprain of unspecified ligament of left ankle, subsequent encounter: Secondary | ICD-10-CM

## 2016-02-09 ENCOUNTER — Other Ambulatory Visit: Payer: Self-pay | Admitting: Podiatry

## 2016-02-09 DIAGNOSIS — R609 Edema, unspecified: Secondary | ICD-10-CM

## 2016-02-09 DIAGNOSIS — M25572 Pain in left ankle and joints of left foot: Secondary | ICD-10-CM

## 2016-02-10 ENCOUNTER — Ambulatory Visit
Admission: RE | Admit: 2016-02-10 | Discharge: 2016-02-10 | Disposition: A | Discharge status: Home / Self Care | Payer: No Typology Code available for payment source | Source: Ambulatory Visit | Attending: Podiatry | Admitting: Podiatry

## 2016-02-10 DIAGNOSIS — R609 Edema, unspecified: Secondary | ICD-10-CM

## 2016-02-10 DIAGNOSIS — M25572 Pain in left ankle and joints of left foot: Secondary | ICD-10-CM

## 2016-03-11 ENCOUNTER — Ambulatory Visit: Payer: No Typology Code available for payment source

## 2016-09-05 IMAGING — US US EXTREM LOW VENOUS*L*
1 series · 13 of 24 positions shown · non-contrast
Comparison: None.

CLINICAL DATA: Left calf pain and swelling for the past 4 days.
History of ankle surgery 3 weeks ago. Evaluate for DVT.



[Series 1: us extrem low venous*left* · 0.08mm/px · 13 of 35 slices shown]
[im 1/35]
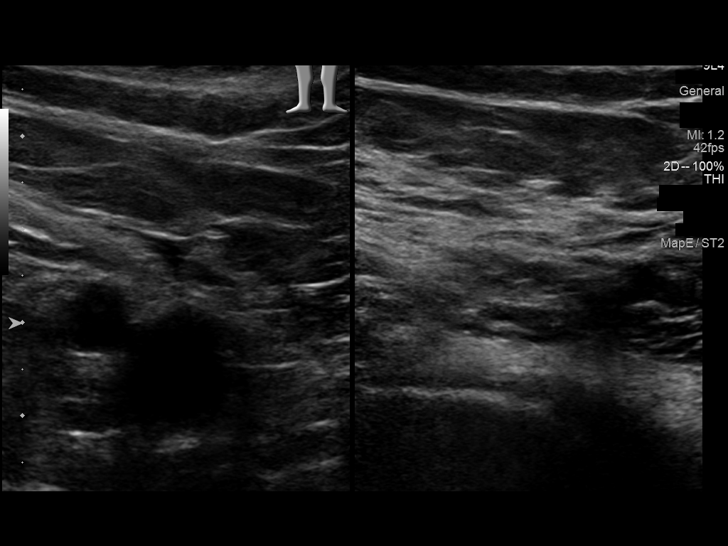
[im 3/35]
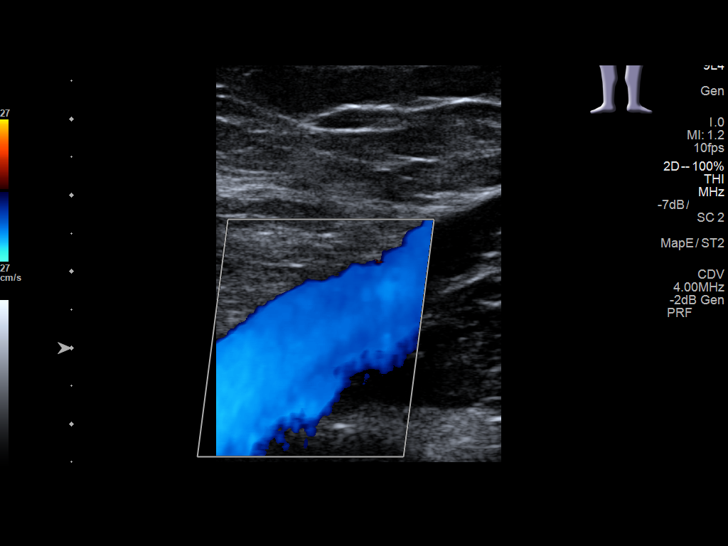
[im 6/35]
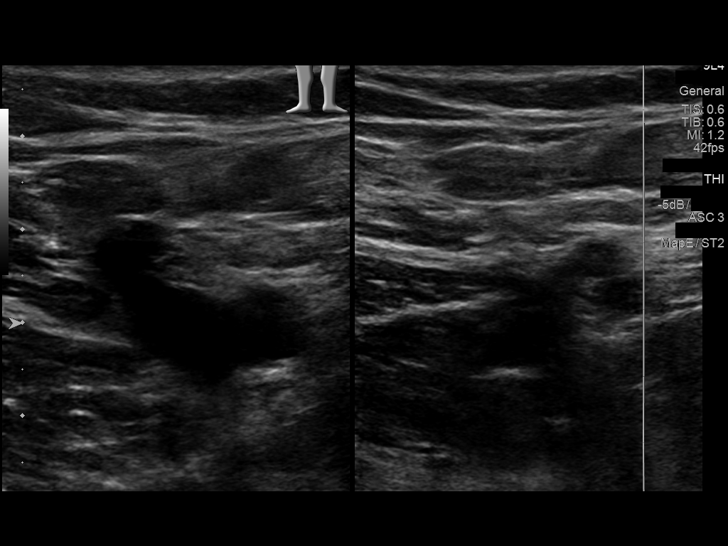
[im 9/35]
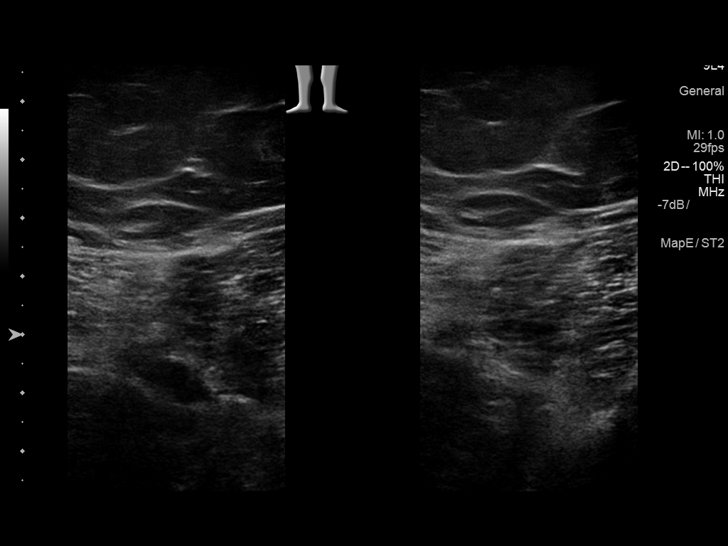
[im 12/35]
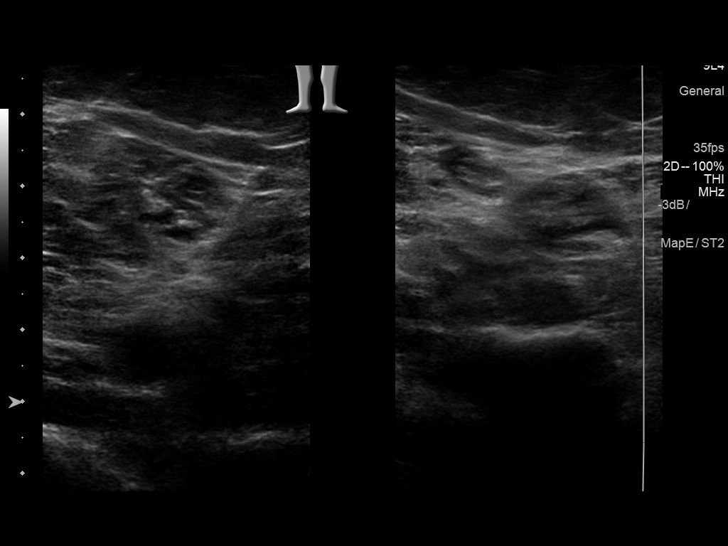
[im 15/35]
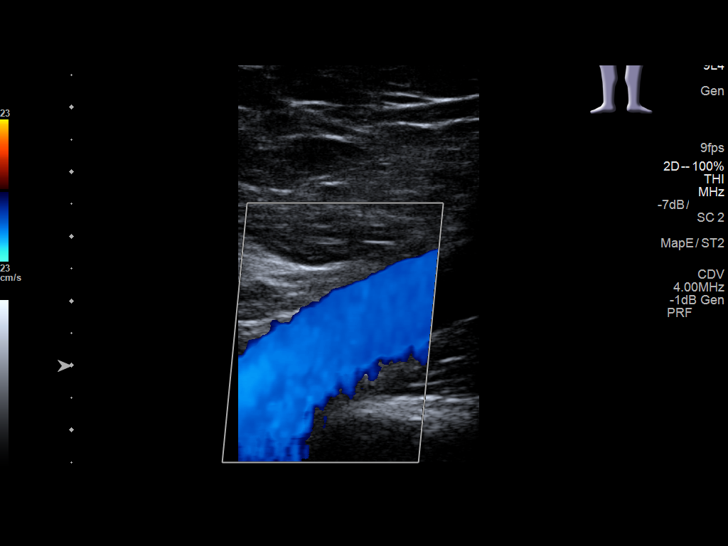
[im 18/35]
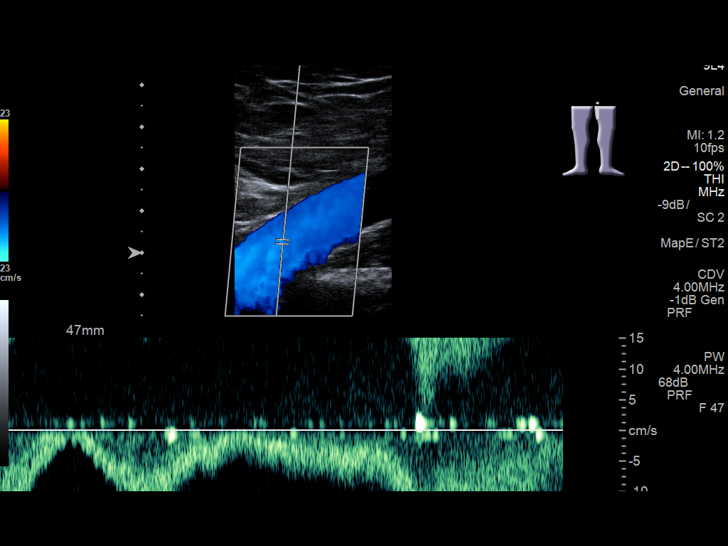
[im 20/35]
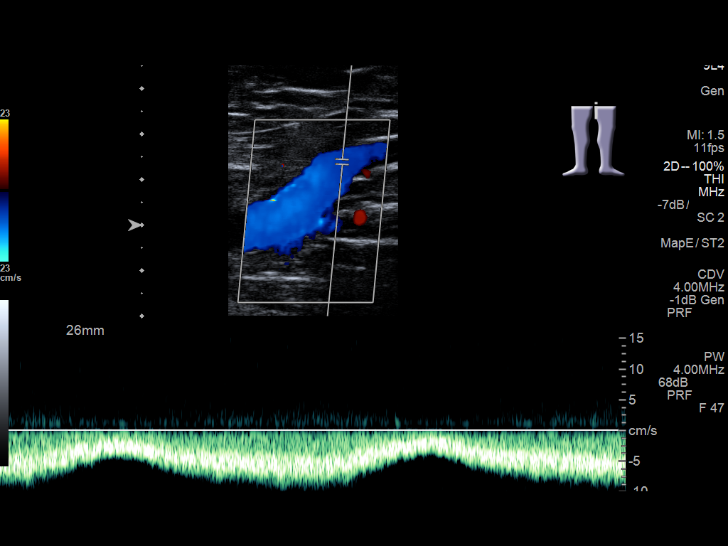
[im 23/35]
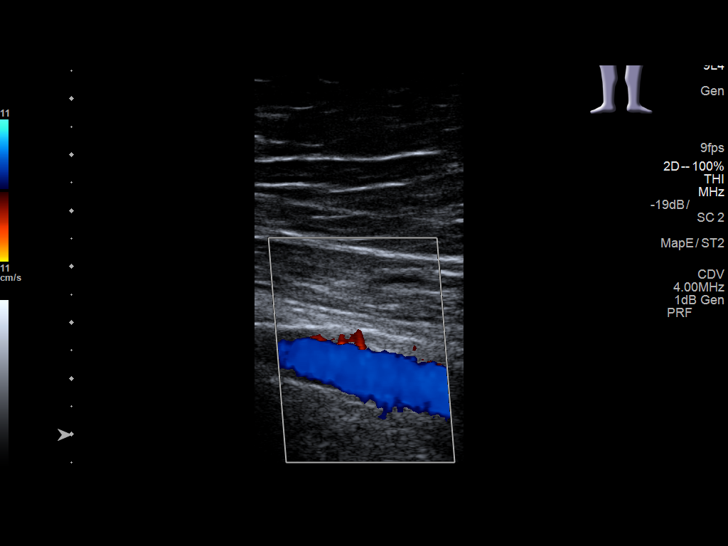
[im 26/35]
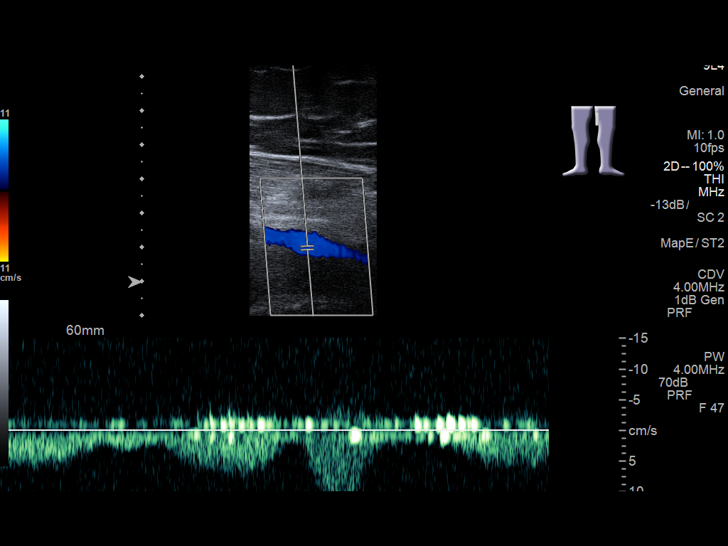
[im 29/35]
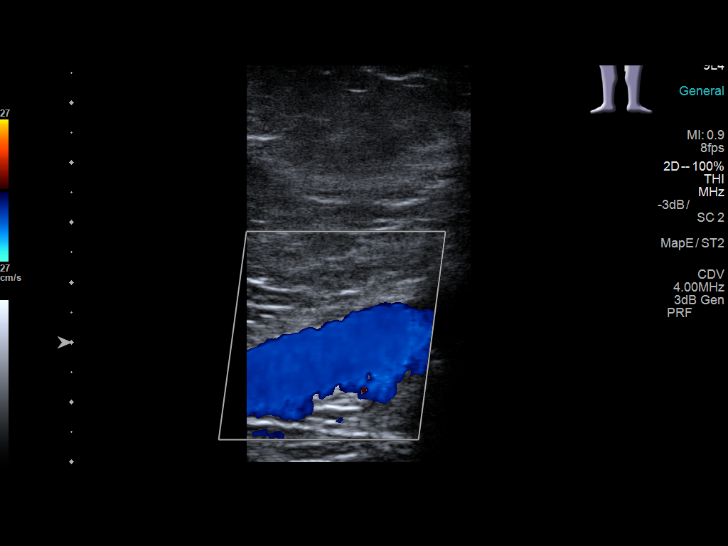
[im 32/35]
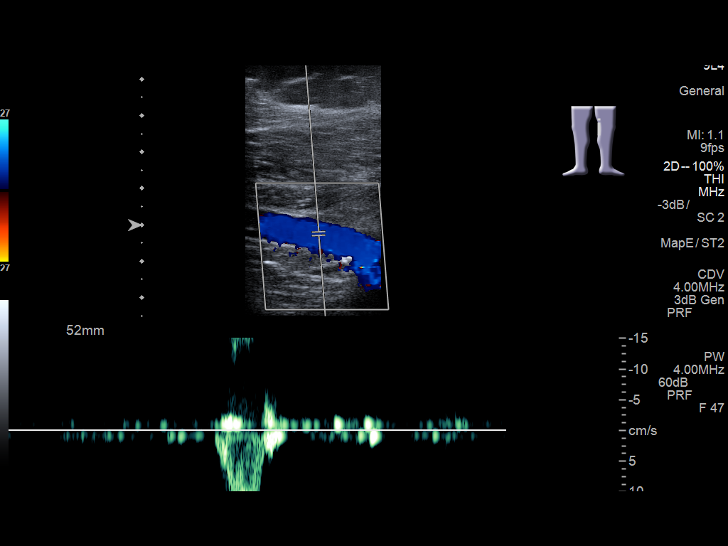
[im 35/35]
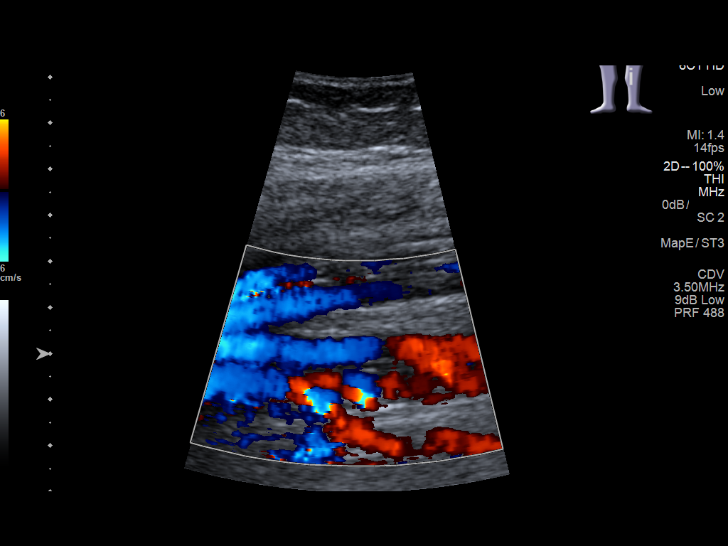

[13 of 24 positions shown; findings below may reference images not displayed]

FINDINGS: Contralateral Common Femoral Vein: Respiratory phasicity is normal
and symmetric with the symptomatic side. No evidence of thrombus.
Normal compressibility.

Common Femoral Vein: No evidence of thrombus. Normal
compressibility, respiratory phasicity and response to augmentation.

Saphenofemoral Junction: No evidence of thrombus. Normal
compressibility and flow on color Doppler imaging.

Profunda Femoral Vein: No evidence of thrombus. Normal
compressibility and flow on color Doppler imaging.

Femoral Vein: No evidence of thrombus. Normal compressibility,
respiratory phasicity and response to augmentation.

Popliteal Vein: No evidence of thrombus. Normal compressibility,
respiratory phasicity and response to augmentation.

Calf Veins: No evidence of thrombus. Normal compressibility and flow
on color Doppler imaging.

Superficial Great Saphenous Vein: No evidence of thrombus. Normal
compressibility and flow on color Doppler imaging.

Venous Reflux:  None.

Other Findings:  None.
IMPRESSION: No evidence of DVT within the left lower extremity.

## 2016-11-20 IMAGING — US US EXTREM LOW VENOUS*L*
1 series · 13 of 24 positions shown · non-contrast
Comparison: Prior duplex venous ultrasound 11/22/2015

CLINICAL DATA: 34-year-old female with left lower extremity pain
and history of left ankle surgery 3 months previously.



[Series 1: us extrem low venous*left* · 0.07mm/px · 13 of 36 slices shown]
[im 1/36]
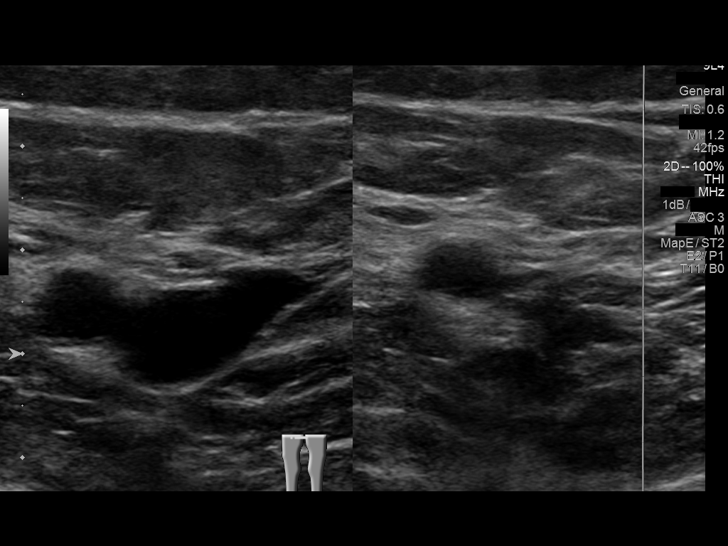
[im 4/36]
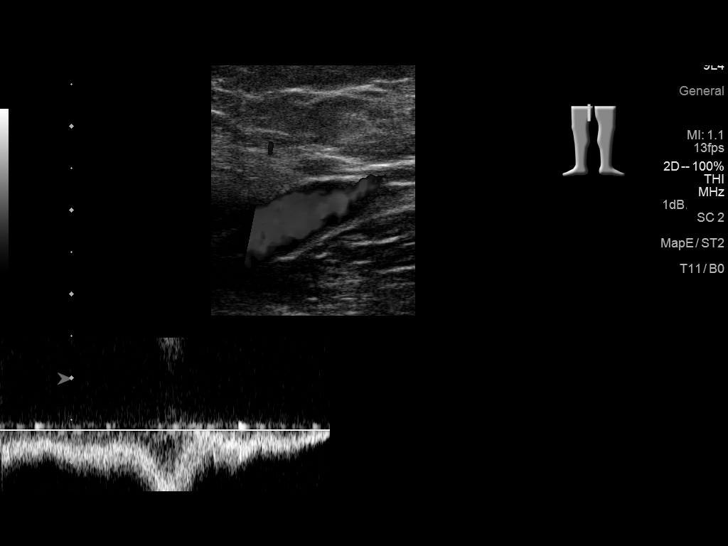
[im 7/36]
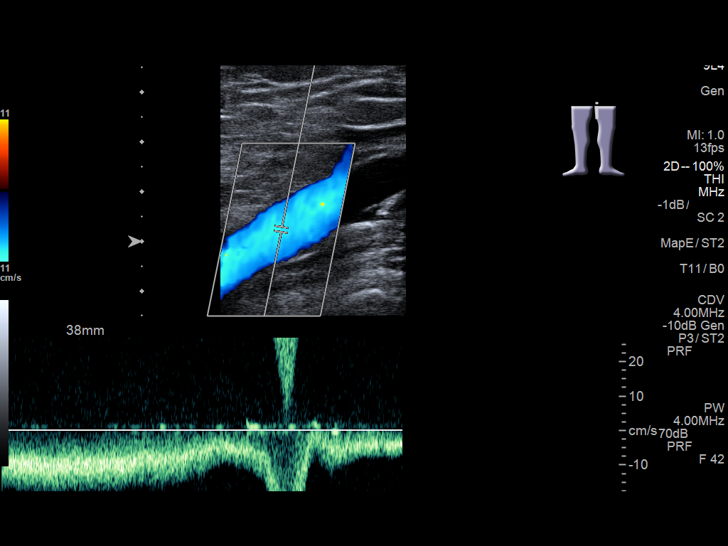
[im 10/36]
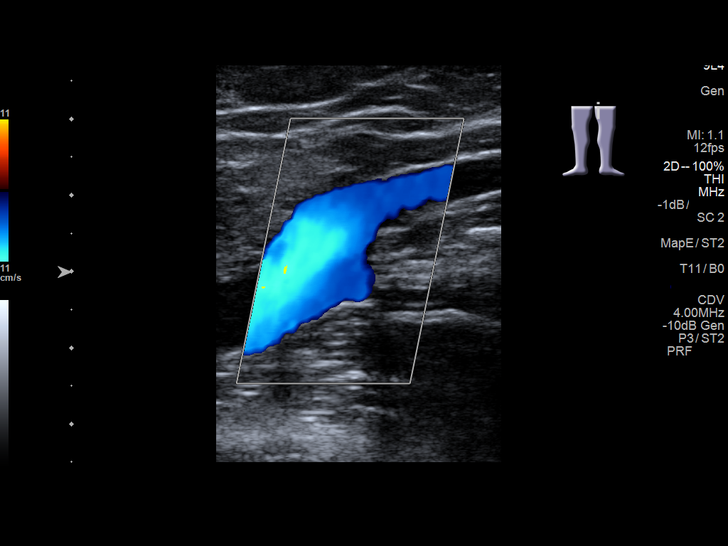
[im 13/36]
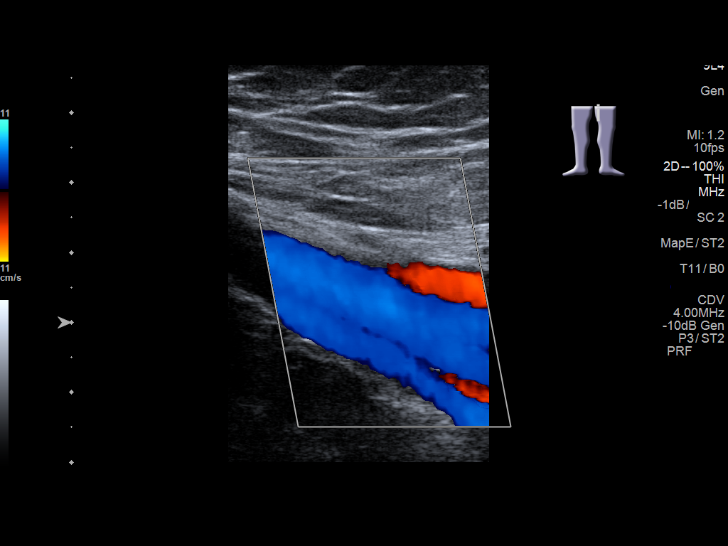
[im 16/36]
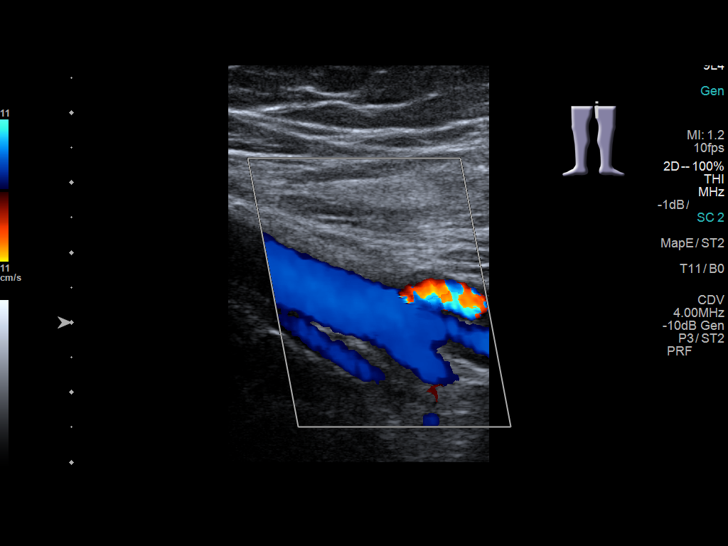
[im 19/36]
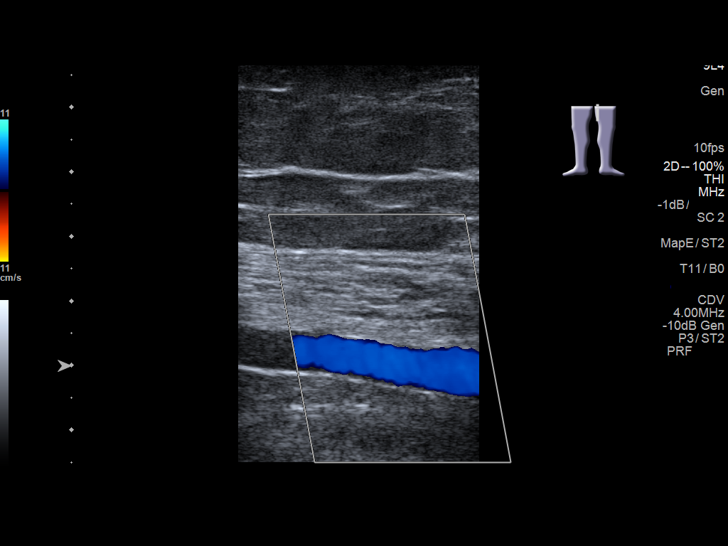
[im 20/36]
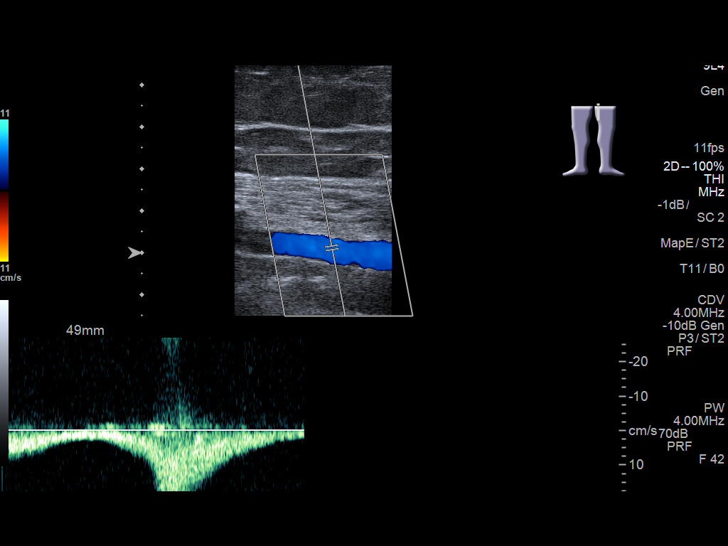
[im 23/36]
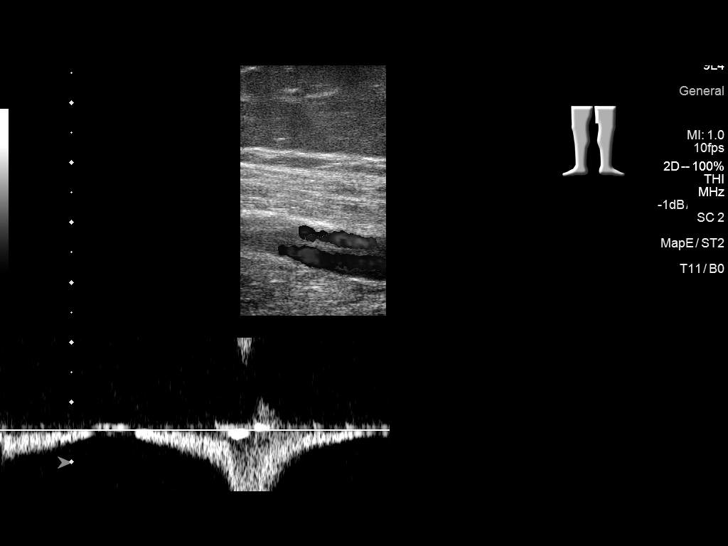
[im 26/36]
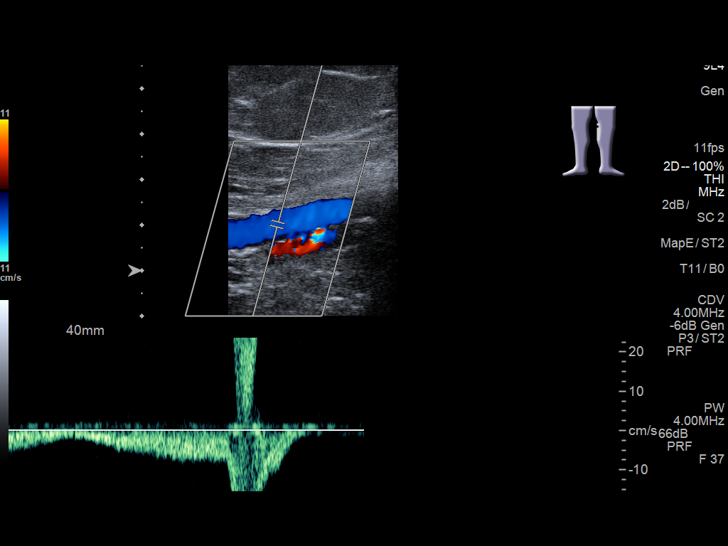
[im 29/36]
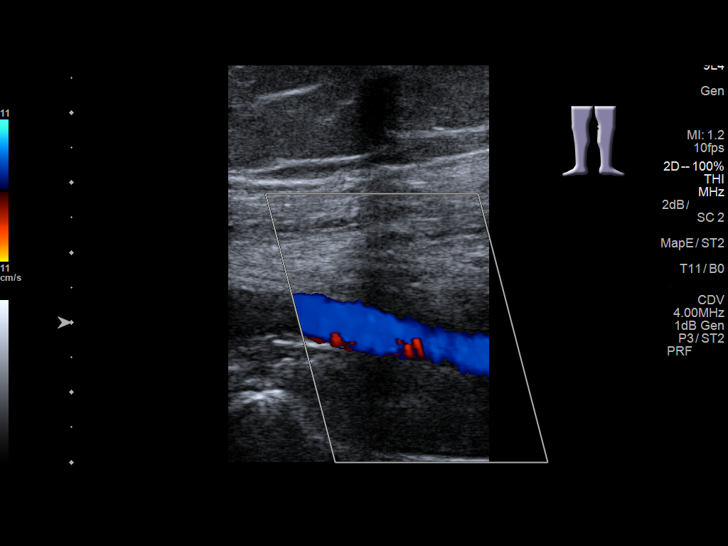
[im 32/36]
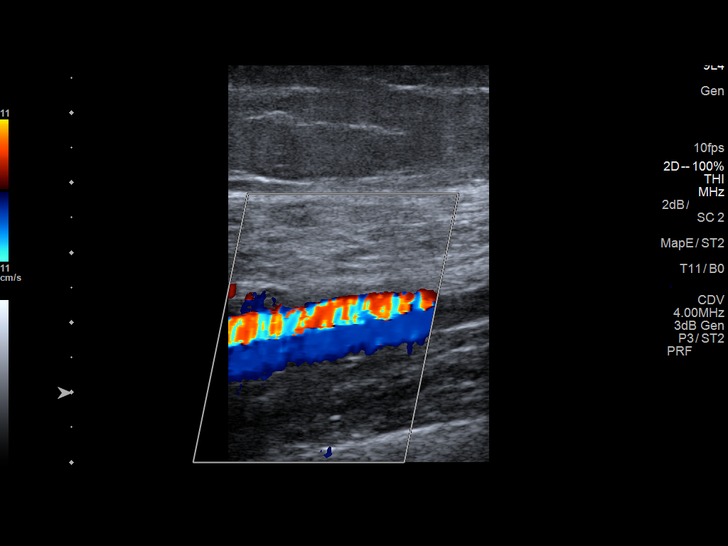
[im 36/36]
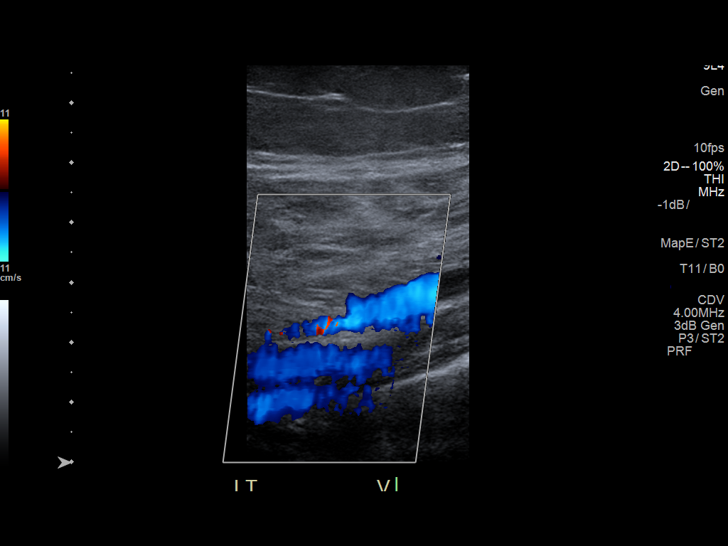

[13 of 24 positions shown; findings below may reference images not displayed]

FINDINGS: Contralateral Common Femoral Vein: Respiratory phasicity is normal
and symmetric with the symptomatic side. No evidence of thrombus.
Normal compressibility.

Common Femoral Vein: No evidence of thrombus. Normal
compressibility, respiratory phasicity and response to augmentation.

Saphenofemoral Junction: No evidence of thrombus. Normal
compressibility and flow on color Doppler imaging.

Profunda Femoral Vein: No evidence of thrombus. Normal
compressibility and flow on color Doppler imaging.

Femoral Vein: No evidence of thrombus. Normal compressibility,
respiratory phasicity and response to augmentation.

Popliteal Vein: No evidence of thrombus. Normal compressibility,
respiratory phasicity and response to augmentation.

Calf Veins: No evidence of thrombus. Normal compressibility and flow
on color Doppler imaging.

Superficial Great Saphenous Vein: No evidence of thrombus. Normal
compressibility and flow on color Doppler imaging.

Venous Reflux:  None.

Other Findings:  None.
IMPRESSION: No evidence of deep venous thrombosis.

## 2017-03-05 IMAGING — MR MR ANKLE*L* W/O CM
6 series · 40 of 40 positions shown · non-contrast
Comparison: None.

CLINICAL DATA: Posterior ankle/heel pain following twisting injury
2 months ago. No previous relevant surgery.

EXAM:
MRI OF THE LEFT ANKLE WITHOUT CONTRAST
TECHNIQUE: Multiplanar, multisequence MR imaging of the ankle was performed. No
intravenous contrast was administered.

[Series 3: PD fat-sat · axial · 3.0mm · 0.56mm/px · z∈[-77,+45]mm · 7 of 35 slices shown]
[im 1/35]
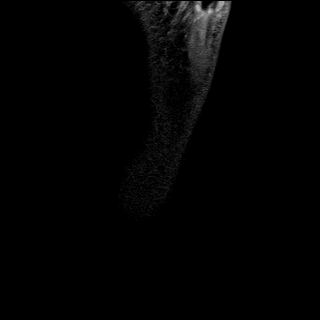
[im 6/35]
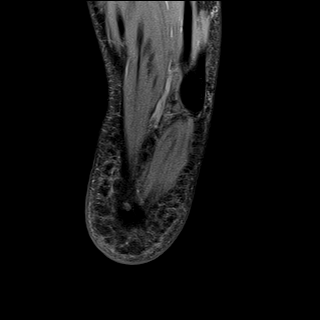
[im 12/35]
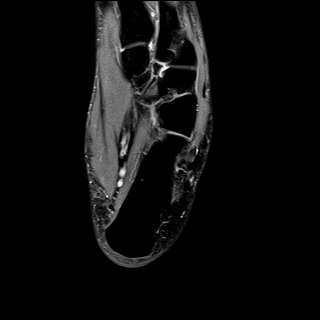
[im 18/35]
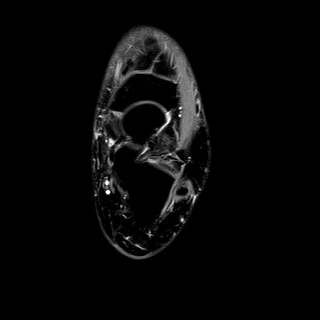
[im 23/35]
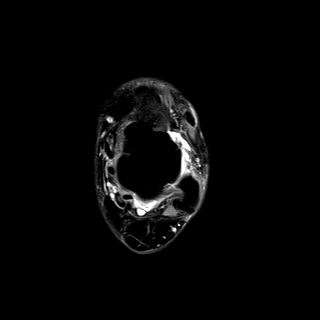
[im 29/35]
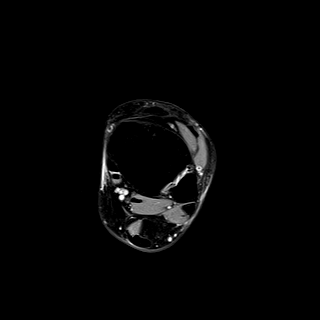
[im 35/35]
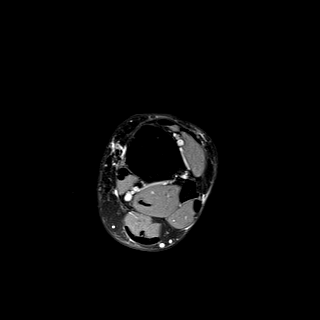

[Series 4: T2 fat-sat · axial · 3.0mm · 0.47mm/px · z∈[-77,+45]mm · 7 of 35 slices shown (1 of 4)]
[im 1/35]
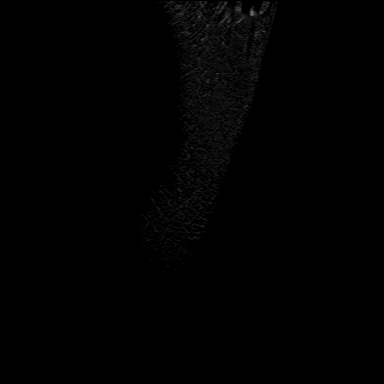
[im 6/35]
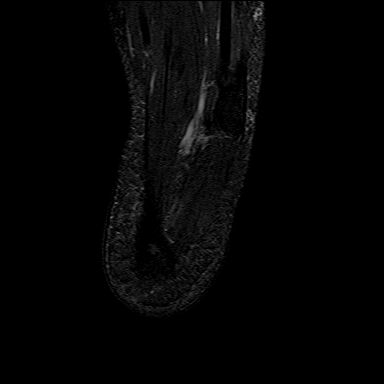
[im 12/35]
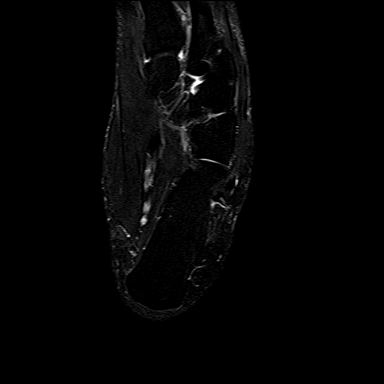
[im 18/35]
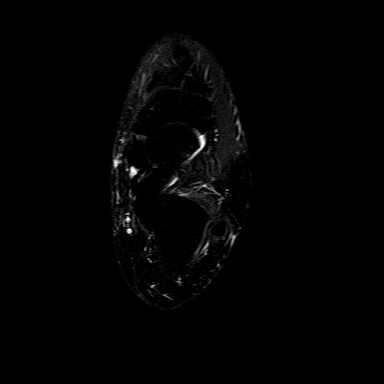
[im 23/35]
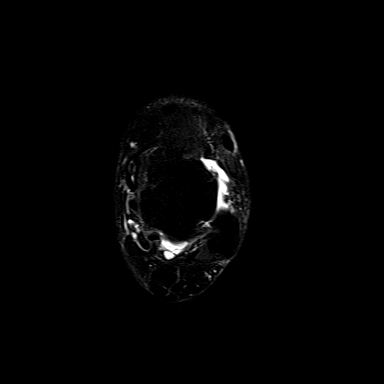
[im 29/35]
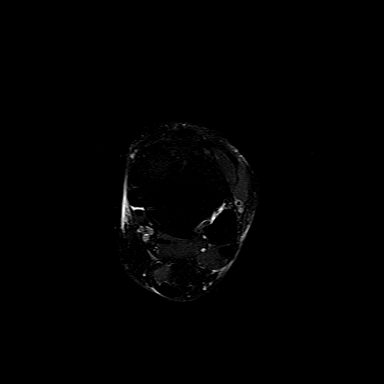
[im 35/35]
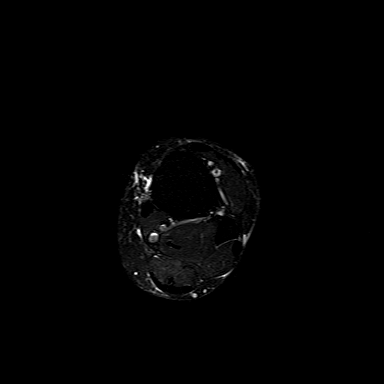

[Series 5: T2 fat-sat · coronal · 3.0mm · 0.56mm/px · 8 of 35 slices shown (2 of 4)]
[im 1/35]
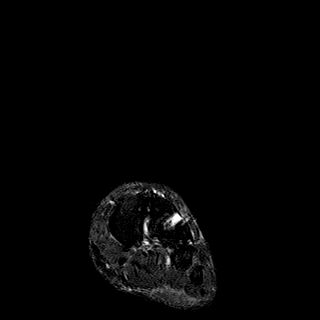
[im 5/35]
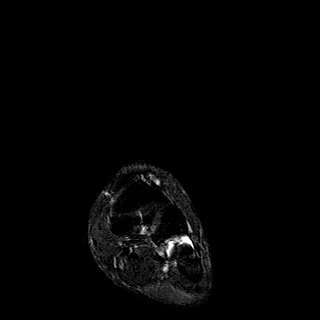
[im 10/35]
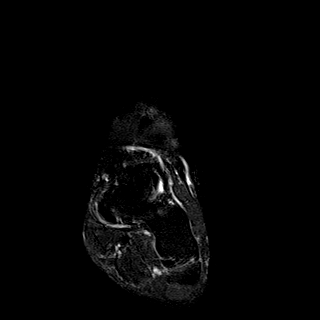
[im 15/35]
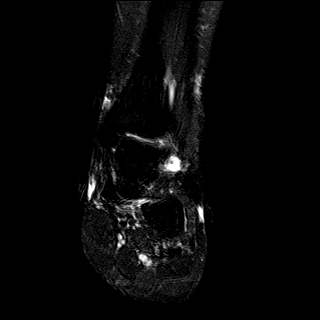
[im 20/35]
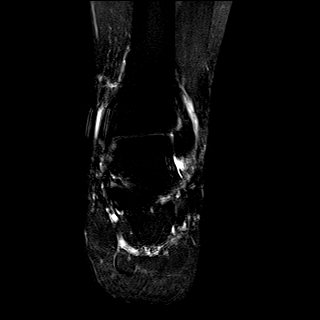
[im 25/35]
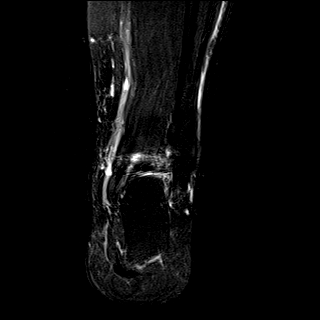
[im 30/35]
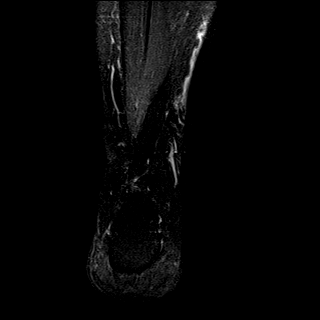
[im 35/35]
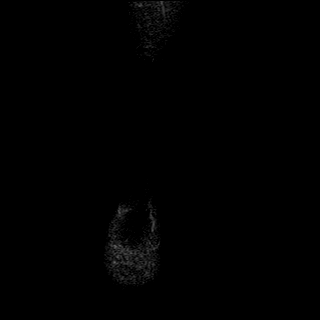

[Series 6: T1 · sagittal · 3.0mm · 0.56mm/px · 5 of 23 slices shown]
[im 1/23]
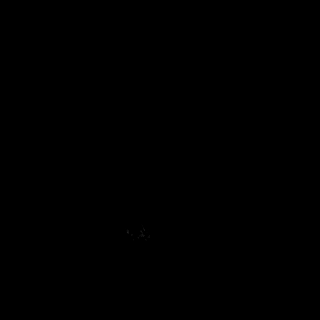
[im 6/23]
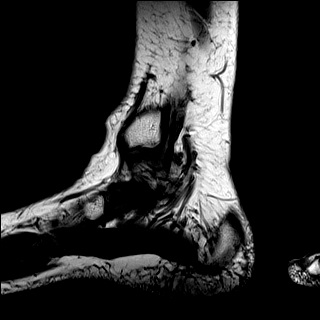
[im 12/23]
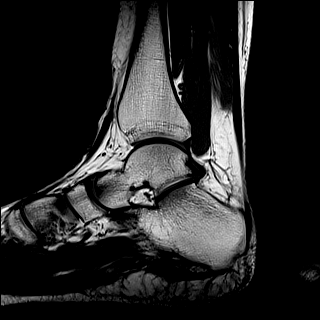
[im 17/23]
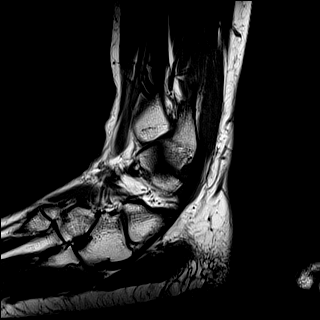
[im 23/23]
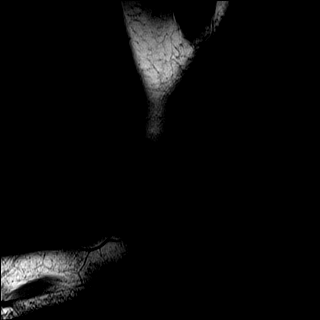

[Series 7: T2 fat-sat · sagittal · 3.0mm · 0.70mm/px · 5 of 23 slices shown (3 of 4)]
[im 1/23]
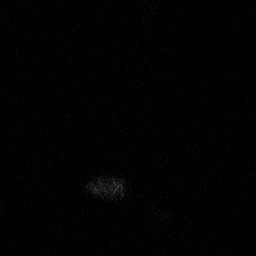
[im 6/23]
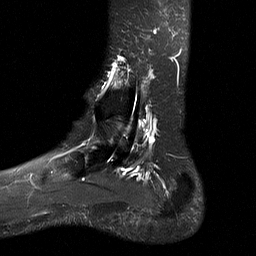
[im 12/23]
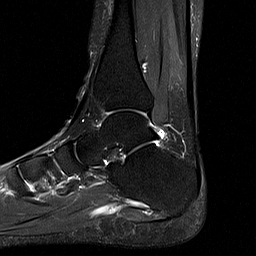
[im 17/23]
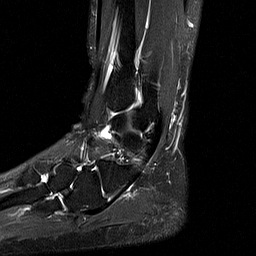
[im 23/23]
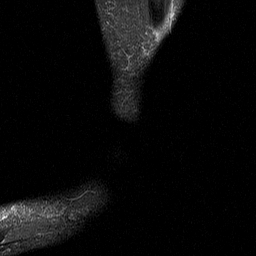

[Series 8: T2 fat-sat · coronal · 3.0mm · 0.70mm/px · 8 of 35 slices shown (4 of 4)]
[im 1/35]
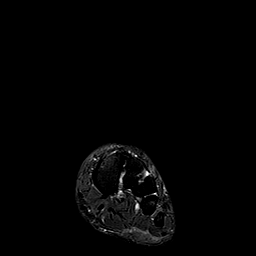
[im 5/35]
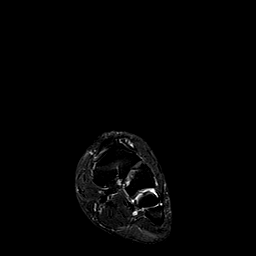
[im 10/35]
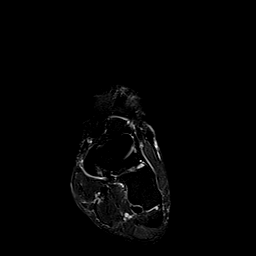
[im 15/35]
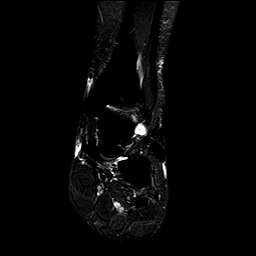
[im 20/35]
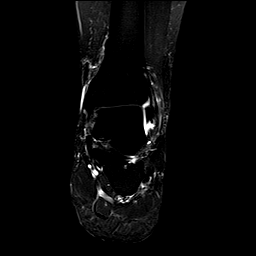
[im 25/35]
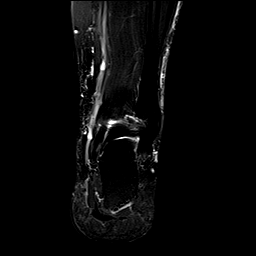
[im 30/35]
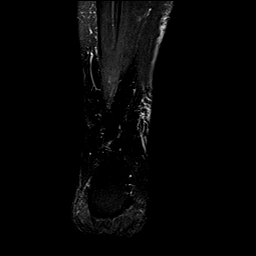
[im 35/35]
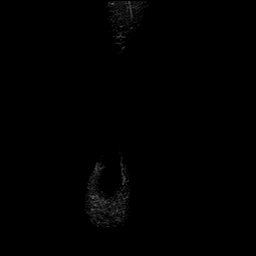

[40 of 40 positions shown; findings below may reference images not displayed]

FINDINGS: TENDONS

Peroneal: The peroneus brevis tendon is flattened at the level of
the fibular tip and demonstrates a short segment longitudinal split
tear. There is no full-thickness tendon tear or retraction. The
peroneus longus tendon appears normal. There is no significant fluid
in the peroneal tendon sheath.

Posteromedial: Intact and normally positioned. There is small amount
fluid within the posterior tibialis tendon sheath.

Anterior: Intact and normally positioned.

Achilles: Intact. There is no edema within the paratenon fat. There
is no bursal fluid collection.

Plantar Fascia: There is minimal T2 hyperintensity at the calcaneal
attachment of the medial cord. There is no focal tear, retraction or
thickening of the plantar fascia. There is no reactive marrow edema.

LIGAMENTS

Lateral: The anterior talofibular ligament appears mildly edematous,
consistent with a healing sprain. The posterior talofibular and
calcaneal fibular ligaments are intact.

Medial: Intact.  There is mild medial subcutaneous edema.

CARTILAGE

Ankle Joint: Small ankle joint effusion. The talar dome and tibial
plafond are intact.

Subtalar Joints/Sinus Tarsi: Unremarkable.

Bones: No significant extra-articular osseous findings.
IMPRESSION: 1. Healing sprain of the anterior talofibular ligament.
2. Peroneus brevis tendinosis with short segment partial
longitudinal split tear.
3. Small ankle joint effusion.  No acute osseous findings.

## 2018-10-15 ENCOUNTER — Ambulatory Visit
Admission: EM | Admit: 2018-10-15 | Discharge: 2018-10-15 | Disposition: A | Discharge status: Home / Self Care | Payer: No Typology Code available for payment source | Attending: Family Medicine | Admitting: Family Medicine

## 2018-10-15 DIAGNOSIS — J069 Acute upper respiratory infection, unspecified: Secondary | ICD-10-CM

## 2018-10-15 MED ORDER — HYDROCOD POLST-CPM POLST ER 10-8 MG/5ML PO SUER: 5.0000 mL | Freq: Two times a day (BID) | ORAL | 0 refills | Status: AC

## 2018-10-15 MED ORDER — BENZONATATE 200 MG PO CAPS: ORAL_CAPSULE | ORAL | 0 refills | Status: AC

## 2018-10-15 MED ORDER — DOXYCYCLINE HYCLATE 100 MG PO CAPS: 100.0000 mg | ORAL_CAPSULE | Freq: Two times a day (BID) | ORAL | 0 refills | Status: AC

## 2018-10-15 NOTE — Discharge Instructions (Addendum)
Drink plenty of fluids.  Rest as much as possible.  Use Tylenol or Motrin for fever chills or body aches.  °

## 2018-10-15 NOTE — ED Provider Notes (Signed)
[facility]    CSN: [id number] Arrival date & time: 10/15/18  1448     History   Chief Complaint Chief Complaint  Patient presents with  . Cough    HPI Beth Sheppard is a 38 y.o. female.   HPI  Female presents with flu that she had 2 weeks ago.  She was not treated with flu at that time because she had not been seen within the window of therapy.  The flu she has been having a cough that she is not able to shake.  Is productive and at times it feels as if a tickle in her throat.  Headache and fatigue.  On Plaquenil and cyclosporine for chronic urticaria.  Because of the immunosuppression she has tendency hanging onto illnesses.  She denies fever or chills.  Have a history of asthma and has been using albuterol inhaler as necessary       Past Medical History:  Diagnosis Date  . Asthma    from allergies  . Family history of adverse reaction to anesthesia    mom- PONV  . Motion sickness    moving vehicles  . PONV (postoperative nausea and vomiting)    also, body temp dropped very low during GYN surgery    There are no active problems to display for this patient.   Past Surgical History:  Procedure Laterality Date  . ABDOMINAL HYSTERECTOMY    . ANKLE ARTHROSCOPY Left 11/01/2015   Procedure: Peroneal brevis tendon repair 2. tenolysis peroneal longus tendon 3. Tenolysis a posterior tibial tendon 4. Capsulitis and synovitis all left lower extremity  ;  Surgeon: Gwyneth Revels, DPM;  Location[facility];  Service: Podiatry;  Laterality: Left;  WITH POPLITEAL Left ankle.  . APPENDECTOMY    . CHOLECYSTECTOMY    . OVARIAN CYST REMOVAL    . TONSILLECTOMY      OB History   No obstetric history on file.      Home Medications    Prior to Admission medications   Medication Sig Start Date End Date Taking? Authorizing Provider  albuterol (PROVENTIL HFA;VENTOLIN HFA) 108 (90 Base) MCG/ACT inhaler Inhale into the lungs every 6 (six) hours as needed for  wheezing or shortness of breath.   Yes [provider]  cetirizine (ZYRTEC) 10 MG tablet Take by mouth.   Yes [provider]  cycloSPORINE modified (NEORAL) 25 MG capsule TAKE 6 CAPSULES BY MOUTH TWICE DAILY 07/20/18  Yes [provider]  estradiol (ESTRACE) 1 MG tablet TAKE 1 TABLET BY MOUTH ONCE DAILY FOR HORMONE THERAPY 10/01/18  Yes [provider]  estradiol (ESTRACE) 2 MG tablet Take 2 mg by mouth daily. PM   Yes [provider]  hydroxychloroquine (PLAQUENIL) 200 MG tablet Take by mouth daily.   Yes [provider]  lisinopril (PRINIVIL,ZESTRIL) 20 MG tablet TAKE 1 TABLET BY MOUTH ONCE DAILY FOR BLOOD PRESSURE 09/14/18  Yes [provider]  montelukast (SINGULAIR) 10 MG tablet Take 10 mg by mouth at bedtime. 10/01/18  Yes [provider]  oxyCODONE-acetaminophen (ROXICET) 5-325 MG tablet Take 1-2 tablets by mouth every 4 (four) hours as needed for severe pain. 11/01/15  Yes Gwyneth Revels, DPM  benzonatate (TESSALON) 200 MG capsule Take one cap TID PRN cough 10/15/18   Lutricia Feil, PA-C  chlorpheniramine-HYDROcodone [facility]) 10-8 MG/5ML SUER Take 5 mLs by mouth 2 (two) times daily. 10/15/18   Lutricia Feil, PA-C  doxycycline (VIBRAMYCIN) 100 MG capsule Take 1  capsule (100 mg total) by mouth 2 (two) times daily. 10/15/18   Lutricia Feiloemer, William P, PA-C    Family History Family History  Problem Relation Age of Onset  . Thyroid disease Mother   . Asthma Father     Social History Social History   Tobacco Use  . Smoking status: Never Smoker  . Smokeless tobacco: Never Used  Substance Use Topics  . Alcohol use: No  . Drug use: No     Allergies   Amoxicillin; Codeine; Lithium; Serotonin reuptake inhibitors (ssris); and Sulfa antibiotics   Review of Systems Review of Systems  Constitutional: Positive for activity change and fatigue. Negative for chills and fever.  HENT: Positive for  congestion and postnasal drip.   Respiratory: Positive for cough.   All other systems reviewed and are negative.    Physical Exam Triage Vital Signs ED Triage Vitals  Enc Vitals Group     BP 10/15/18 1524 (!) 129/93     Pulse Rate 10/15/18 1523 85     Resp 10/15/18 1523 18     Temp 10/15/18 1523 98.4 F (36.9 C)     Temp Source 10/15/18 1523 Oral     SpO2 10/15/18 1523 98 %     Weight 10/15/18 1525 275 lb (124.7 kg)     Height 10/15/18 1525 5\' 7"  (1.702 m)     Head Circumference --      Peak Flow --      Pain Score 10/15/18 1525 2     Pain Loc --      Pain Edu? --      Excl. in GC? --    No data found.  Updated Vital Signs BP (!) 129/93   Pulse 85   Temp 98.4 F (36.9 C) (Oral)   Resp 18   Ht 5\' 7"  (1.702 m)   Wt 275 lb (124.7 kg)   SpO2 98%   BMI 43.07 kg/m   Visual Acuity Right Eye Distance:   Left Eye Distance:   Bilateral Distance:    Right Eye Near:   Left Eye Near:    Bilateral Near:     Physical Exam Vitals signs and nursing note reviewed.  Constitutional:      General: She is not in acute distress.    Appearance: Normal appearance. She is obese. She is not ill-appearing, toxic-appearing or diaphoretic.  HENT:     Head: Normocephalic and atraumatic.     Right Ear: Tympanic membrane, ear canal and external ear normal.     Left Ear: Tympanic membrane, ear canal and external ear normal.     Nose: Congestion present. No rhinorrhea.     Mouth/Throat:     Mouth: Mucous membranes are moist.     Pharynx: No oropharyngeal exudate or posterior oropharyngeal erythema.  Eyes:     General:        Right eye: No discharge.        Left eye: No discharge.     Conjunctiva/sclera: Conjunctivae normal.  Neck:     Musculoskeletal: Normal range of motion and neck supple.  Pulmonary:     Effort: Pulmonary effort is normal.     Breath sounds: Normal breath sounds.  Musculoskeletal: Normal range of motion.  Lymphadenopathy:     Cervical: No cervical adenopathy.   Skin:    General: Skin is warm and dry.  Neurological:     General: No focal deficit present.     Mental Status: She is alert and oriented  to person, place, and time.  Psychiatric:        Mood and Affect: Mood normal.        Behavior: Behavior normal.        Thought Content: Thought content normal.        Judgment: Judgment normal.      UC Treatments / Results  Labs (all labs ordered are listed, but only abnormal results are displayed) Labs Reviewed - No data to display  EKG None  Radiology No results found.  Procedures Procedures (including critical care time)  Medications Ordered in UC Medications - No data to display  Initial Impression / Assessment and Plan / UC Course  I have reviewed the triage vital signs and the nursing notes.  Pertinent labs & imaging results that were available during my care of the patient were reviewed by me and considered in my medical decision making (see chart for details).   Patient [facility] me [facility]  are not available to that facility through epic everywhere.  I have told her that it does not appear that she would benefit or need to have an x-ray today.  However I will treat her empirically since she is on the immunosuppressants.  We will start her on doxycycline and cough suppressants.  Commended that she follow-up with her primary care next week.  Worsen she should go to the emergency room.   Final Clinical Impressions(s) / UC Diagnoses   Final diagnoses:  Upper respiratory tract infection, unspecified type     Discharge Instructions     Drink plenty of fluids.  Rest as much as possible.  Use Tylenol or Motrin for fever chills or body aches.    ED Prescriptions    Medication Sig Dispense Auth. Provider   doxycycline (VIBRAMYCIN) 100 MG capsule Take 1 capsule (100 mg total) by mouth 2 (two) times daily. 14 capsule Ovid Curdoemer, William P, PA-C   benzonatate (TESSALON) 200 MG capsule Take one cap  TID PRN cough 30 capsule Lutricia Feiloemer, William P, PA-C   chlorpheniramine-HYDROcodone (TUSSIONEX PENNKINETIC ER) 10-8 MG/5ML SUER Take 5 mLs by mouth 2 (two) times daily. 115 mL Lutricia Feiloemer, William P, PA-C     Controlled Substance Prescriptions [place] Controlled Substance Registry consulted? Not Applicable   Lutricia FeilRoemer, William P, PA-C 10/15/18 [id number]

## 2018-10-15 NOTE — ED Triage Notes (Signed)
Pt was diagnosed with flu a 2 weeks ago and has been having a cough since then she cannot shake. States sometimes its productive and sometimes just a "tickle in her throat" along with headaches and fatigue.
